# Patient Record
Sex: Female | Born: 1950
Health system: Southern US, Community
[De-identification: ages and names within clinical notes are randomized; demographics above are authoritative.]

## PROBLEM LIST (undated history)

## (undated) DIAGNOSIS — E039 Hypothyroidism, unspecified: Secondary | ICD-10-CM

## (undated) DIAGNOSIS — R002 Palpitations: Secondary | ICD-10-CM

## (undated) HISTORY — PX: OTHER SURGICAL HISTORY: SHX169

## (undated) HISTORY — DX: Palpitations: R00.2

## (undated) HISTORY — PX: APPENDECTOMY: SHX54

## (undated) HISTORY — DX: Hypothyroidism, unspecified: E03.9

## (undated) HISTORY — PX: BREAST ENHANCEMENT SURGERY: SHX7

## (undated) HISTORY — PX: PARTIAL HYSTERECTOMY: SHX80

---

## 2001-05-30 ENCOUNTER — Encounter: Admission: RE | Admit: 2001-05-30 | Discharge: 2001-05-30 | Payer: Self-pay | Admitting: *Deleted

## 2001-05-30 ENCOUNTER — Encounter: Payer: Self-pay | Admitting: *Deleted

## 2011-10-28 ENCOUNTER — Emergency Department (HOSPITAL_COMMUNITY)
Admission: EM | Admit: 2011-10-28 | Discharge: 2011-10-29 | Disposition: A | Discharge status: Home / Self Care | Payer: BC Managed Care – PPO | Attending: Emergency Medicine | Admitting: Emergency Medicine

## 2011-10-28 ENCOUNTER — Encounter (HOSPITAL_COMMUNITY): Payer: Self-pay | Admitting: Emergency Medicine

## 2011-10-28 DIAGNOSIS — R42 Dizziness and giddiness: Secondary | ICD-10-CM | POA: Insufficient documentation

## 2011-10-28 DIAGNOSIS — I10 Essential (primary) hypertension: Secondary | ICD-10-CM | POA: Insufficient documentation

## 2011-10-28 DIAGNOSIS — R112 Nausea with vomiting, unspecified: Secondary | ICD-10-CM | POA: Insufficient documentation

## 2011-10-28 DIAGNOSIS — R202 Paresthesia of skin: Secondary | ICD-10-CM

## 2011-10-28 DIAGNOSIS — R209 Unspecified disturbances of skin sensation: Secondary | ICD-10-CM | POA: Insufficient documentation

## 2011-10-28 DIAGNOSIS — Z79899 Other long term (current) drug therapy: Secondary | ICD-10-CM | POA: Insufficient documentation

## 2011-10-28 LAB — CBC
HCT: 39.3 % (ref 36.0–46.0)
Hemoglobin: 13.6 g/dL (ref 12.0–15.0)
MCH: 30.2 pg (ref 26.0–34.0)
MCHC: 34.6 g/dL (ref 30.0–36.0)
MCV: 87.1 fL (ref 78.0–100.0)
Platelets: 316 10*3/uL (ref 150–400)
RBC: 4.51 MIL/uL (ref 3.87–5.11)
RDW: 13 % (ref 11.5–15.5)
WBC: 8.4 10*3/uL (ref 4.0–10.5)

## 2011-10-28 LAB — DIFFERENTIAL
Basophils Absolute: 0 10*3/uL (ref 0.0–0.1)
Basophils Relative: 0 % (ref 0–1)
Eosinophils Absolute: 0.1 10*3/uL (ref 0.0–0.7)
Eosinophils Relative: 2 % (ref 0–5)
Monocytes Absolute: 0.9 10*3/uL (ref 0.1–1.0)

## 2011-10-28 NOTE — ED Notes (Signed)
PT. REPORTS LEFT FACIAL NUMBNESS FOR SEVERAL DAYS SEEN BY PCP AND MRI DONE AT [facility] - RESULT IS NEGATIVE , PRESCRIBED WITH ANTIHYPERTENSIVE MEDICATION BY PCP , VOMITTING YESTERDAY.

## 2011-10-29 LAB — BASIC METABOLIC PANEL
CO2: 26 mEq/L (ref 19–32)
Calcium: 9.5 mg/dL (ref 8.4–10.5)
Creatinine, Ser: 0.65 mg/dL (ref 0.50–1.10)
GFR calc Af Amer: >90 mL/min (ref >90)
GFR calc non Af Amer: >90 mL/min (ref >90)
Sodium: 137 mEq/L (ref 135–145)

## 2011-10-29 MED ORDER — ONDANSETRON 4 MG PO TBDP
8.0000 mg | ORAL_TABLET | Freq: Once | ORAL | Status: AC
  Administered 2011-10-29: 8 mg via ORAL

## 2011-10-29 MED ORDER — ONDANSETRON 4 MG PO TBDP
ORAL_TABLET | ORAL | Status: AC
  Filled 2011-10-29: qty 2

## 2011-10-29 MED ORDER — ONDANSETRON 8 MG PO TBDP: 8.0000 mg | ORAL_TABLET | Freq: Three times a day (TID) | ORAL | Status: AC | PRN

## 2011-10-29 NOTE — ED Provider Notes (Signed)
History     CSN: [id number]  Arrival date & time 10/28/11  2137   First MD Initiated Contact with Patient 10/29/11 0132      Chief Complaint  Patient presents with  . Numbness     The history is provided by the patient.   patient reports several days of tingling of the left side of her head and face.  She had several transient episodes of "dizziness".  She had an MRI done as an outpatient today as well as an MRA of her brain which demonstrates no evidence of stroke and no aneurysms.  She has no stroke risk factors.  At this time she reports she feels good.  She has had several days of nausea and nonbloody nonbilious vomiting.  She denies diarrhea.. she has no abdominal pain.  Her symptoms are mild.  She presents the ER now because her family wants to know more about why she is having these paresthesias of the left side of her head  Past Medical History  Diagnosis Date  . Hypertension     History reviewed. No pertinent past surgical history.  No family history on file.  History  Substance Use Topics  . Smoking status: Never Smoker   . Smokeless tobacco: Not on file  . Alcohol Use: No    OB History    Grav Para Term Preterm Abortions TAB SAB Ect Mult Living                  Review of Systems  All other systems reviewed and are negative.    Allergies  Codeine and Sulfa antibiotics  Home Medications   Current Outpatient Rx  Name Route Sig Dispense Refill  . B COMPLEX-C PO TABS Oral Take 1 tablet by mouth daily.    Marland Kitchen SYNTHROID PO Oral Take 1 tablet by mouth every morning.    . CYTOMEL PO Oral Take 1 tablet by mouth daily.    . ADULT MULTIVITAMIN W/MINERALS CH Oral Take 1 tablet by mouth daily.    Marland Kitchen PROGESTERONE MICRONIZED 100 MG PO CAPS Oral Take 100 mg by mouth daily.      BP 131/50  Pulse 72  Temp(Src) 97.9 F (36.6 C) (Oral)  Resp 17  SpO2 100%  Physical Exam  Nursing note and vitals reviewed. Constitutional: She is oriented to person, place, and time.  She appears well-developed and well-nourished. No distress.  HENT:  Head: Normocephalic and atraumatic.  Eyes: EOM are normal. Pupils are equal, round, and reactive to light.  Neck: Normal range of motion.  Cardiovascular: Normal rate, regular rhythm and normal heart sounds.   Pulmonary/Chest: Effort normal and breath sounds normal.  Abdominal: Soft. She exhibits no distension. There is no tenderness.  Musculoskeletal: Normal range of motion.  Neurological: She is alert and oriented to person, place, and time.       5/5 strength in major muscle groups of  bilateral upper and lower extremities. Speech normal. No facial asymetry. Normal finger to nose bilaterally  Skin: Skin is warm and dry.  Psychiatric: She has a normal mood and affect. Judgment normal.    ED Course  Procedures (including critical care time)   Labs Reviewed  CBC  DIFFERENTIAL  BASIC METABOLIC PANEL   No results found.   1. Paresthesia       MDM  The patient has paresthesias of her left head and face.  Her MRI and MRA of her head today done as an outpatient demonstrates no evidence  of stroke.  She has no carotid bruits.  She has no stroke risk factors.  DC home with urology followup        Lyanne Co, MD 10/29/11 [id number]

## 2011-10-29 NOTE — Discharge Instructions (Signed)
Paresthesia  Paresthesia is an abnormal burning or prickling sensation. This sensation is generally felt in the hands, arms, legs, or feet. However, it may occur in any part of the body. It is usually not painful. The feeling may be described as:  · Tingling or numbness.  · "Pins and needles."  · Skin crawling.  · Buzzing.  · Limbs "falling asleep."  · Itching.  Most people experience temporary (transient) paresthesia at some time in their lives.  CAUSES   Paresthesia may occur when you breathe too quickly (hyperventilation). It can also occur without any apparent cause. Commonly, paresthesia occurs when pressure is placed on a nerve. The feeling quickly goes away once the pressure is removed. For some people, however, paresthesia is a long-lasting (chronic) condition caused by an underlying disorder. The underlying disorder may be:  · A traumatic, direct injury to nerves. Examples include a:  · Broken (fractured) neck.  · Fractured skull.  · A disorder affecting the brain and spinal cord (central nervous system). Examples include:  · Transverse myelitis.  · Encephalitis.  · Transient ischemic attack.  · Multiple sclerosis.  · Stroke.  · Tumor or blood vessel problems, such as an arteriovenous malformation pressing against the brain or spinal cord.  · A condition that damages the peripheral nerves (peripheral neuropathy). Peripheral nerves are not part of the brain and spinal cord. These conditions include:  · Diabetes.  · Peripheral vascular disease.  · Nerve entrapment syndromes, such as carpal tunnel syndrome.  · Shingles.  · Hypothyroidism.  · Vitamin B12 deficiencies.  · Alcoholism.  · Heavy metal poisoning (lead, arsenic).  · Rheumatoid arthritis.  · Systemic lupus erythematosus.  DIAGNOSIS   Your caregiver will attempt to find the underlying cause of your paresthesia. Your caregiver may:  · Take your medical history.  · Perform a physical exam.  · Order various lab tests.  · Order imaging tests.  TREATMENT    Treatment for paresthesia depends on the underlying cause.  HOME CARE INSTRUCTIONS  · Avoid drinking alcohol.  · You may consider massage or acupuncture to help relieve your symptoms.  · Keep all follow-up appointments as directed by your caregiver.  SEEK IMMEDIATE MEDICAL CARE IF:   · You feel weak.  · You have trouble walking or moving.  · You have problems with speech or vision.  · You feel confused.  · You cannot control your bladder or bowel movements.  · You feel numbness after an injury.  · You faint.  · Your burning or prickling feeling gets worse when walking.  · You have pain, cramps, or dizziness.  · You develop a rash.  MAKE SURE YOU:  · Understand these instructions.  · Will watch your condition.  · Will get help right away if you are not doing well or get worse.  Document Released: 05/24/2002 Document Revised: 05/23/2011 Document Reviewed: 02/22/2011  Exit[employer]® Patient Information ©2012 [employer].

## 2011-10-29 NOTE — ED Notes (Signed)
Pt c/o numbness that started in left eye several days ago.  Has spread through jaw and left neck.  Denies injury.  Comments that blood pressure has been higher than normal.  Received MRI earlier today and has results with her.  Was told MRI/MRA was normal.  Also states numbness is in parts of left arm.  Complaining of HA on left side of head and n/v.

## 2014-04-18 ENCOUNTER — Other Ambulatory Visit (HOSPITAL_COMMUNITY): Payer: Self-pay | Admitting: Internal Medicine

## 2014-04-18 ENCOUNTER — Ambulatory Visit (HOSPITAL_COMMUNITY)
Admission: RE | Admit: 2014-04-18 | Discharge: 2014-04-18 | Disposition: A | Discharge status: Home / Self Care | Payer: BC Managed Care – PPO | Source: Ambulatory Visit | Attending: Internal Medicine | Admitting: Internal Medicine

## 2014-04-18 ENCOUNTER — Ambulatory Visit (HOSPITAL_COMMUNITY): Admission: RE | Admit: 2014-04-18 | Payer: BC Managed Care – PPO | Source: Ambulatory Visit

## 2014-04-18 DIAGNOSIS — R109 Unspecified abdominal pain: Secondary | ICD-10-CM | POA: Diagnosis present

## 2014-04-18 DIAGNOSIS — N202 Calculus of kidney with calculus of ureter: Secondary | ICD-10-CM

## 2014-04-25 ENCOUNTER — Other Ambulatory Visit (HOSPITAL_COMMUNITY): Payer: BC Managed Care – PPO

## 2014-06-01 ENCOUNTER — Encounter (INDEPENDENT_AMBULATORY_CARE_PROVIDER_SITE_OTHER): Payer: Self-pay | Admitting: *Deleted

## 2014-07-12 ENCOUNTER — Encounter (INDEPENDENT_AMBULATORY_CARE_PROVIDER_SITE_OTHER): Payer: Self-pay | Admitting: Internal Medicine

## 2014-07-12 ENCOUNTER — Ambulatory Visit (INDEPENDENT_AMBULATORY_CARE_PROVIDER_SITE_OTHER): Payer: BLUE CROSS/BLUE SHIELD | Admitting: Internal Medicine

## 2014-07-12 ENCOUNTER — Other Ambulatory Visit (INDEPENDENT_AMBULATORY_CARE_PROVIDER_SITE_OTHER): Payer: Self-pay | Admitting: *Deleted

## 2014-07-12 ENCOUNTER — Telehealth (INDEPENDENT_AMBULATORY_CARE_PROVIDER_SITE_OTHER): Payer: Self-pay | Admitting: *Deleted

## 2014-07-12 VITALS — BP 154/64 | HR 72 | Temp 97.7°F | Ht 63.0 in | Wt 152.1 lb

## 2014-07-12 DIAGNOSIS — Z1211 Encounter for screening for malignant neoplasm of colon: Secondary | ICD-10-CM

## 2014-07-12 DIAGNOSIS — N200 Calculus of kidney: Secondary | ICD-10-CM

## 2014-07-12 DIAGNOSIS — K625 Hemorrhage of anus and rectum: Secondary | ICD-10-CM

## 2014-07-12 LAB — CBC WITH DIFFERENTIAL/PLATELET
BASOS PCT: 0 % (ref 0–1)
Basophils Absolute: 0 10*3/uL (ref 0.0–0.1)
EOS ABS: 0.2 10*3/uL (ref 0.0–0.7)
Eosinophils Relative: 3 % (ref 0–5)
HCT: 37.9 % (ref 36.0–46.0)
Hemoglobin: 13 g/dL (ref 12.0–15.0)
LYMPHS PCT: 35 % (ref 12–46)
Lymphs Abs: 2.6 10*3/uL (ref 0.7–4.0)
MCH: 29.7 pg (ref 26.0–34.0)
MCHC: 34.3 g/dL (ref 30.0–36.0)
MCV: 86.7 fL (ref 78.0–100.0)
MONO ABS: 1 10*3/uL (ref 0.1–1.0)
MONOS PCT: 13 % — AB (ref 3–12)
MPV: 9.2 fL (ref 8.6–12.4)
Neutro Abs: 3.7 10*3/uL (ref 1.7–7.7)
Neutrophils Relative %: 49 % (ref 43–77)
Platelets: 358 10*3/uL (ref 150–400)
RBC: 4.37 MIL/uL (ref 3.87–5.11)
RDW: 13.3 % (ref 11.5–15.5)
WBC: 7.5 10*3/uL (ref 4.0–10.5)

## 2014-07-12 NOTE — Telephone Encounter (Signed)
Patient needs trilyte 

## 2014-07-12 NOTE — Patient Instructions (Signed)
Colonoscopy. The risks and benefits such as perforation, bleeding, and infection were reviewed with the patient and is agreeable.The risks and benefits such as perforation, bleeding, and infection were reviewed with the patient and is agreeable. 

## 2014-07-12 NOTE — Progress Notes (Signed)
   Subjective:    Patient ID: Elizabeth Miranda, female    DOB: 12-03-1950, 64 y.o.   MRN: [id number]  HPI Referred to our office from Dr. Nevada Crane for rectal bleeding. She says she was also having a kidney stone.during this time.  She cannot tell me if she was bleeding from her urine or her rectum. This occurred in November. No further rectal bleeding. The bleeding occurred x 1 day and then resolved. She has never undergone a colonoscopy in the past.   Appetite is good. No weight loss. She feels great. No abdominal pain She has a BM x 2-3 times a day. No further rectal bleeding. No melena or BRRB. No family hx of colon cancer.  No NSAIDs.     Review of Systems Past Medical History  Diagnosis Date  . Hypothyroid     Past Surgical History  Procedure Laterality Date  . Appendectomy    . Partial hysterectomy    . Complete hysterectomy'      Allergies  Allergen Reactions  . Codeine     Hives   . Sulfa Antibiotics     Hives     Current Outpatient Prescriptions on File Prior to Visit  Medication Sig Dispense Refill  . B Complex-C (B-COMPLEX WITH VITAMIN C) tablet Take 1 tablet by mouth daily.    . Levothyroxine Sodium (SYNTHROID PO) Take 88 mcg by mouth every morning.     . Liothyronine Sodium (CYTOMEL PO) Take 1 tablet by mouth daily.    . Multiple Vitamin (MULITIVITAMIN WITH MINERALS) TABS Take 1 tablet by mouth daily.    . progesterone (PROMETRIUM) 100 MG capsule Take 100 mg by mouth daily.     No current facility-administered medications on file prior to visit.        Objective:   Physical Exam  Filed Vitals:   07/12/14 1519  Height: 5\' 3"  (1.6 m)  Weight: 152 lb 1.6 oz (68.992 kg)    Alert and oriented. Skin warm and dry. Oral mucosa is moist.   . Sclera anicteric, conjunctivae is pink. Thyroid not enlarged. No cervical lymphadenopathy. Lungs clear. Heart regular rate and rhythm.  Abdomen is soft. Bowel sounds are positive. No hepatomegaly. No abdominal masses  felt. No tenderness.  No edema to lower extremities. Stool brown and guaiac negative.       Assessment & Plan:  ?Rectal bleeding .Recent hx of kidney stone with blood in her urine. Has never undergone a colonoscopy in the past. Colonoscopy. The risks and benefits such as perforation, bleeding, and infection were reviewed with the patient and is agreeable. CBC today

## 2014-07-14 MED ORDER — PEG 3350-KCL-NA BICARB-NACL 420 G PO SOLR: 4000.0000 mL | Freq: Once | ORAL | Status: DC

## 2014-08-10 ENCOUNTER — Encounter (HOSPITAL_COMMUNITY): Admission: RE | Payer: Self-pay | Source: Ambulatory Visit

## 2014-08-10 ENCOUNTER — Ambulatory Visit (HOSPITAL_COMMUNITY)
Admission: RE | Admit: 2014-08-10 | Payer: BLUE CROSS/BLUE SHIELD | Source: Ambulatory Visit | Admitting: Internal Medicine

## 2014-08-10 SURGERY — COLONOSCOPY
Anesthesia: Moderate Sedation

## 2014-10-19 DIAGNOSIS — N959 Unspecified menopausal and perimenopausal disorder: Secondary | ICD-10-CM | POA: Diagnosis not present

## 2014-10-19 DIAGNOSIS — E063 Autoimmune thyroiditis: Secondary | ICD-10-CM | POA: Diagnosis not present

## 2014-10-19 DIAGNOSIS — E782 Mixed hyperlipidemia: Secondary | ICD-10-CM | POA: Diagnosis not present

## 2014-10-19 DIAGNOSIS — E282 Polycystic ovarian syndrome: Secondary | ICD-10-CM | POA: Diagnosis not present

## 2014-10-19 DIAGNOSIS — E039 Hypothyroidism, unspecified: Secondary | ICD-10-CM | POA: Diagnosis not present

## 2014-11-24 DIAGNOSIS — E063 Autoimmune thyroiditis: Secondary | ICD-10-CM | POA: Diagnosis not present

## 2014-11-24 DIAGNOSIS — E349 Endocrine disorder, unspecified: Secondary | ICD-10-CM | POA: Diagnosis not present

## 2014-11-24 DIAGNOSIS — E039 Hypothyroidism, unspecified: Secondary | ICD-10-CM | POA: Diagnosis not present

## 2014-11-24 DIAGNOSIS — E559 Vitamin D deficiency, unspecified: Secondary | ICD-10-CM | POA: Diagnosis not present

## 2014-12-07 DIAGNOSIS — T560X4A Toxic effect of lead and its compounds, undetermined, initial encounter: Secondary | ICD-10-CM | POA: Diagnosis not present

## 2014-12-07 DIAGNOSIS — T561X4A Toxic effect of mercury and its compounds, undetermined, initial encounter: Secondary | ICD-10-CM | POA: Diagnosis not present

## 2014-12-14 DIAGNOSIS — H259 Unspecified age-related cataract: Secondary | ICD-10-CM | POA: Diagnosis not present

## 2014-12-14 DIAGNOSIS — H524 Presbyopia: Secondary | ICD-10-CM | POA: Diagnosis not present

## 2015-02-03 DIAGNOSIS — E063 Autoimmune thyroiditis: Secondary | ICD-10-CM | POA: Diagnosis not present

## 2015-02-03 DIAGNOSIS — N959 Unspecified menopausal and perimenopausal disorder: Secondary | ICD-10-CM | POA: Diagnosis not present

## 2015-02-03 DIAGNOSIS — E039 Hypothyroidism, unspecified: Secondary | ICD-10-CM | POA: Diagnosis not present

## 2015-03-22 DIAGNOSIS — M9903 Segmental and somatic dysfunction of lumbar region: Secondary | ICD-10-CM | POA: Diagnosis not present

## 2015-03-22 DIAGNOSIS — M5441 Lumbago with sciatica, right side: Secondary | ICD-10-CM | POA: Diagnosis not present

## 2015-03-22 DIAGNOSIS — S335XXA Sprain of ligaments of lumbar spine, initial encounter: Secondary | ICD-10-CM | POA: Diagnosis not present

## 2015-03-27 DIAGNOSIS — S134XXA Sprain of ligaments of cervical spine, initial encounter: Secondary | ICD-10-CM | POA: Diagnosis not present

## 2015-03-27 DIAGNOSIS — M9902 Segmental and somatic dysfunction of thoracic region: Secondary | ICD-10-CM | POA: Diagnosis not present

## 2015-03-27 DIAGNOSIS — M9901 Segmental and somatic dysfunction of cervical region: Secondary | ICD-10-CM | POA: Diagnosis not present

## 2015-03-27 DIAGNOSIS — M546 Pain in thoracic spine: Secondary | ICD-10-CM | POA: Diagnosis not present

## 2015-03-27 DIAGNOSIS — M9903 Segmental and somatic dysfunction of lumbar region: Secondary | ICD-10-CM | POA: Diagnosis not present

## 2015-03-27 DIAGNOSIS — S335XXA Sprain of ligaments of lumbar spine, initial encounter: Secondary | ICD-10-CM | POA: Diagnosis not present

## 2015-03-27 DIAGNOSIS — M5441 Lumbago with sciatica, right side: Secondary | ICD-10-CM | POA: Diagnosis not present

## 2015-03-29 DIAGNOSIS — M9902 Segmental and somatic dysfunction of thoracic region: Secondary | ICD-10-CM | POA: Diagnosis not present

## 2015-03-29 DIAGNOSIS — M5441 Lumbago with sciatica, right side: Secondary | ICD-10-CM | POA: Diagnosis not present

## 2015-03-29 DIAGNOSIS — M546 Pain in thoracic spine: Secondary | ICD-10-CM | POA: Diagnosis not present

## 2015-03-29 DIAGNOSIS — S134XXA Sprain of ligaments of cervical spine, initial encounter: Secondary | ICD-10-CM | POA: Diagnosis not present

## 2015-03-29 DIAGNOSIS — S335XXA Sprain of ligaments of lumbar spine, initial encounter: Secondary | ICD-10-CM | POA: Diagnosis not present

## 2015-03-29 DIAGNOSIS — M9901 Segmental and somatic dysfunction of cervical region: Secondary | ICD-10-CM | POA: Diagnosis not present

## 2015-03-29 DIAGNOSIS — M9903 Segmental and somatic dysfunction of lumbar region: Secondary | ICD-10-CM | POA: Diagnosis not present

## 2015-04-28 DIAGNOSIS — M10059 Idiopathic gout, unspecified hip: Secondary | ICD-10-CM | POA: Diagnosis not present

## 2015-05-15 DIAGNOSIS — M79604 Pain in right leg: Secondary | ICD-10-CM | POA: Diagnosis not present

## 2015-05-15 DIAGNOSIS — M7061 Trochanteric bursitis, right hip: Secondary | ICD-10-CM | POA: Diagnosis not present

## 2015-05-16 DIAGNOSIS — M791 Myalgia: Secondary | ICD-10-CM | POA: Diagnosis not present

## 2015-06-20 DIAGNOSIS — M9903 Segmental and somatic dysfunction of lumbar region: Secondary | ICD-10-CM | POA: Diagnosis not present

## 2015-06-20 DIAGNOSIS — S338XXA Sprain of other parts of lumbar spine and pelvis, initial encounter: Secondary | ICD-10-CM | POA: Diagnosis not present

## 2015-06-21 DIAGNOSIS — S338XXA Sprain of other parts of lumbar spine and pelvis, initial encounter: Secondary | ICD-10-CM | POA: Diagnosis not present

## 2015-06-21 DIAGNOSIS — S73101A Unspecified sprain of right hip, initial encounter: Secondary | ICD-10-CM | POA: Diagnosis not present

## 2015-06-21 DIAGNOSIS — M9903 Segmental and somatic dysfunction of lumbar region: Secondary | ICD-10-CM | POA: Diagnosis not present

## 2015-06-22 DIAGNOSIS — S73101A Unspecified sprain of right hip, initial encounter: Secondary | ICD-10-CM | POA: Diagnosis not present

## 2015-06-22 DIAGNOSIS — S338XXA Sprain of other parts of lumbar spine and pelvis, initial encounter: Secondary | ICD-10-CM | POA: Diagnosis not present

## 2015-06-22 DIAGNOSIS — M9903 Segmental and somatic dysfunction of lumbar region: Secondary | ICD-10-CM | POA: Diagnosis not present

## 2015-06-23 DIAGNOSIS — S73101A Unspecified sprain of right hip, initial encounter: Secondary | ICD-10-CM | POA: Diagnosis not present

## 2015-06-23 DIAGNOSIS — M9903 Segmental and somatic dysfunction of lumbar region: Secondary | ICD-10-CM | POA: Diagnosis not present

## 2015-06-23 DIAGNOSIS — S338XXA Sprain of other parts of lumbar spine and pelvis, initial encounter: Secondary | ICD-10-CM | POA: Diagnosis not present

## 2015-06-26 DIAGNOSIS — S338XXA Sprain of other parts of lumbar spine and pelvis, initial encounter: Secondary | ICD-10-CM | POA: Diagnosis not present

## 2015-06-26 DIAGNOSIS — M9903 Segmental and somatic dysfunction of lumbar region: Secondary | ICD-10-CM | POA: Diagnosis not present

## 2015-06-26 DIAGNOSIS — S73101A Unspecified sprain of right hip, initial encounter: Secondary | ICD-10-CM | POA: Diagnosis not present

## 2015-06-30 DIAGNOSIS — S338XXA Sprain of other parts of lumbar spine and pelvis, initial encounter: Secondary | ICD-10-CM | POA: Diagnosis not present

## 2015-06-30 DIAGNOSIS — M7060 Trochanteric bursitis, unspecified hip: Secondary | ICD-10-CM | POA: Diagnosis not present

## 2015-06-30 DIAGNOSIS — S73101A Unspecified sprain of right hip, initial encounter: Secondary | ICD-10-CM | POA: Diagnosis not present

## 2015-06-30 DIAGNOSIS — M9903 Segmental and somatic dysfunction of lumbar region: Secondary | ICD-10-CM | POA: Diagnosis not present

## 2016-01-15 DIAGNOSIS — M79604 Pain in right leg: Secondary | ICD-10-CM | POA: Diagnosis not present

## 2016-01-22 ENCOUNTER — Other Ambulatory Visit (HOSPITAL_COMMUNITY): Payer: Self-pay | Admitting: Internal Medicine

## 2016-01-22 DIAGNOSIS — M79604 Pain in right leg: Secondary | ICD-10-CM

## 2016-01-22 DIAGNOSIS — M5417 Radiculopathy, lumbosacral region: Secondary | ICD-10-CM

## 2016-02-01 ENCOUNTER — Ambulatory Visit (HOSPITAL_COMMUNITY): Payer: BLUE CROSS/BLUE SHIELD

## 2016-02-01 ENCOUNTER — Ambulatory Visit (HOSPITAL_COMMUNITY)
Admission: RE | Admit: 2016-02-01 | Discharge: 2016-02-01 | Disposition: A | Discharge status: Home / Self Care | Payer: Medicare Other | Source: Ambulatory Visit | Attending: Internal Medicine | Admitting: Internal Medicine

## 2016-02-01 ENCOUNTER — Other Ambulatory Visit (HOSPITAL_COMMUNITY): Payer: BLUE CROSS/BLUE SHIELD

## 2016-02-01 DIAGNOSIS — M25551 Pain in right hip: Secondary | ICD-10-CM | POA: Diagnosis not present

## 2016-02-01 DIAGNOSIS — M5416 Radiculopathy, lumbar region: Secondary | ICD-10-CM | POA: Insufficient documentation

## 2016-02-01 DIAGNOSIS — M79604 Pain in right leg: Secondary | ICD-10-CM

## 2016-02-01 DIAGNOSIS — M5417 Radiculopathy, lumbosacral region: Secondary | ICD-10-CM

## 2016-02-01 DIAGNOSIS — M769 Unspecified enthesopathy, lower limb, excluding foot: Secondary | ICD-10-CM | POA: Insufficient documentation

## 2016-03-11 ENCOUNTER — Ambulatory Visit (HOSPITAL_COMMUNITY): Payer: BLUE CROSS/BLUE SHIELD | Admitting: Physical Therapy

## 2016-03-13 ENCOUNTER — Ambulatory Visit (HOSPITAL_COMMUNITY): Payer: Medicare Other | Attending: Internal Medicine

## 2016-03-13 DIAGNOSIS — M25552 Pain in left hip: Secondary | ICD-10-CM | POA: Insufficient documentation

## 2016-03-13 DIAGNOSIS — M25551 Pain in right hip: Secondary | ICD-10-CM | POA: Insufficient documentation

## 2016-03-13 DIAGNOSIS — M6281 Muscle weakness (generalized): Secondary | ICD-10-CM | POA: Diagnosis not present

## 2016-03-13 NOTE — Therapy (Signed)
[facility] [place], [place], [address] Phone: [phone]   Fax:  [phone]  Physical Therapy Evaluation  Patient Details  Name: Elizabeth Miranda MRN: [id number] Date of Birth: 22-Jul-1950 Referring Provider: Celene Squibb   Encounter Date: 03/13/2016      PT End of Session - 03/13/16 1639    Visit Number 1   Number of Visits 6   Date for PT Re-Evaluation 04/12/16   Authorization Type Medicare trad   Authorization Time Period 03/13/16-04/24/16   Authorization - Visit Number 1   Authorization - Number of Visits 10   PT Start Time 1519   PT Stop Time 1635   PT Time Calculation (min) 76 min   Activity Tolerance Patient tolerated treatment well;Patient limited by fatigue;Patient limited by pain      Past Medical History:  Diagnosis Date  . Hypothyroid     Past Surgical History:  Procedure Laterality Date  . APPENDECTOMY    . complete hysterectomy'    . PARTIAL HYSTERECTOMY      There were no vitals filed for this visit.       Subjective Assessment - 03/13/16 1525    Subjective Pt reports back in August 2016 some postrior hip pain that continued to worsen and gradually become probelmatic on the R side as well. She had negative XRAY, but subsequent MRI revealing some insertional hamstrings tendonopathy. Since has resolved about 75% overall since she updated to more firm mattress. Also has a history of some crossFit , walking for activity, and other home exercises.    Pertinent History no prior history.    Limitations --  Ascending Steps at home is painful.    Currently in Pain? Yes   Pain Score 2    Pain Location Buttocks   Pain Orientation Right;Left  R>L   Pain Descriptors / Indicators Sharp   Pain Radiating Towards R knee down into just below the knee joint.    Pain Onset Other (comment)  >1yearAgo    Aggravating Factors  Lying on R side at night, going up steps    Pain Relieving Factors NSAIDs  originally not helpful; mostly extra strength tylenol also not too helpful.             [facility] PT Assessment - 03/13/16 0001      Assessment   Medical Diagnosis R posterio buttocks pain    Referring Provider Celene Squibb    Onset Date/Surgical Date --  >1 year ago   Hand Dominance Right   Next MD Visit None   Prior Therapy Chiro (TENS, ultrasound); Massage (c short term relief)      Balance Screen   Has the patient fallen in the past 6 months No   Has the patient had a decrease in activity level because of a fear of falling?  Yes   Is the patient reluctant to leave their home because of a fear of falling?  No     Prior Function   Level of Independence Independent     ROM / Strength   AROM / PROM / Strength Strength;PROM     PROM   Overall PROM Comments Abduction: L->40*; R->32* (painful)  Hypermobile Hip Flexion and gross rotation, fem antev B      Strength   Strength Assessment Site Hip;Knee;Ankle   Right/Left Hip Right;Left   Right Hip Flexion 5/5   Right Hip Extension --  HS 4/5; GMax 5/5   Right  Hip External Rotation  5/5   Right Hip Internal Rotation 5/5   Right Hip ABduction --  Seated: 5/5, TFL: 4/5 (painful), GMed: 3+/5   Right Hip ADduction 5/5   Left Hip Flexion 5/5   Left Hip Extension --  HS 4/5; GMax 4+/5   Left Hip External Rotation 4+/5  pain at left lateral hip   Left Hip Internal Rotation 5/5   Left Hip ABduction --  Seated: 5/5, TFL: 4/5 (painful), GMed: 3+/5   Left Hip ADduction 5/5   Right/Left Knee Right;Left   Right Knee Flexion 5/5  pain free   Right Knee Extension 5/5   Left Knee Flexion 5/5  pain free   Left Knee Extension 5/5   Right Ankle Dorsiflexion 5/5   Left Ankle Dorsiflexion 5/5     Flexibility   Soft Tissue Assessment /Muscle Length yes   Hamstrings --  R Long Adductors/Gracilis: tight, boggy, painful.      Palpation   Spinal mobility Central pain c springing at L4; L lateral pain springing L2   SI assessment   Appears intact/uninvolved.      Hip Special Tests: -Grind Test for hip OA: Negative Bilat -FADIR Test for anterior impingement: Negative Bilat  -Patricks Test: negative bilat -External derotation test: Postive on R for glute medius tendinosis.  -Resisted Abducted Straight Leg Raise: positive for TFL pain/weakness bilat (L>R) -Pubic Symphysis Shotgun Test: negative -Step Down Test: positive bilat for valgus moment at the knee  Hamstrings examination: -palpation of the insertional tendon: without pain bilat -resisted knee flexion seated: without pain bilat -resisted knee flexion prone: without pain bilat -resisted prone straight leg raise: without pain bilat         PT Education - 03/13/16 1637    Education provided Yes   Education Details Finding not supportive of intraarticular pathology of hip, nor of hamstrings tendon pathology. Explained positive tests, and low level suspicious for lumbar spine involvement.    Person(s) Educated Patient   Methods Explanation;Demonstration   Comprehension Verbalized understanding          PT Short Term Goals - 03/13/16 2125      PT SHORT TERM GOAL #1   Title After 4 weeks patient will report a 50% increase in tolerated walking time during weekly exercise.    Status New     PT SHORT TERM GOAL #2   Title After 4 weeks patient will report <2/10 pain with palpation of bilat gluteal musculature to demonstrate improved fascial restrictions.    Status New     PT SHORT TERM GOAL #3   Title After 4 weeks patient will demonstrate sidelying hip abduction of 15x without stopping to demonstrate improved hip strength.            PT Long Term Goals - 03/13/16 2127      PT LONG TERM GOAL #1   Title After 6 weeks patient will report 100% increase in tolerated walking time during exercise.    Status New     PT LONG TERM GOAL #2   Title After 6 weeks patient will report 0/10 pain with ascending/descending <12 steps.    Status New     PT  LONG TERM GOAL #3   Title After 6 weeks patient will demonstrate indepdence in advanced home exercise program to continue hip strengthening after dischrge.    Status New               Plan - 03/13/16 2115  Clinical Impression Statement Pt reports a long history of bilateral trochanteric pain, originating on the R and then eventually bilaterally. Her historic aggravating factors are consistent with typical trochanteric pain syndrome. She demonstrates muscle spasm in bilat glute med R>L, tightness in R long adductors with pain, pain and weakness in bilat TFL, tenderness at the lumbar spine, and reduced tolerance of sleeping postitions and leisure activity. Pt reports imaging studies indicating some hamstrings insertional tendinopathy, however all attempts to recreate pts symptoms through HS are negative. Special testing of hip intrcapsular patholoy is negative and seems unlikely, which includes labral lesion. Special test of Glute med tendinous injury is positive on right side. Presentation suspicious for lumbar spine etiology but all testing negative at this time.    Rehab Potential Excellent   Clinical Impairments Affecting Rehab Potential chronicity suspicious for degenerative tendinosis.    PT Frequency 1x / week   PT Duration 6 weeks   PT Treatment/Interventions ADLs/Self Care Home Management;Moist Heat;Electrical Stimulation;Gait training;Stair training;Functional mobility training;Therapeutic activities;Therapeutic exercise;Balance training;Manual techniques;Patient/family education;Dry needling;Passive range of motion   PT Next Visit Plan Review HEP, get SLS balance times, MFR to glute med, TFL, R long adductors.    PT Home Exercise Plan 03/25/2016: Clam band, reverse clam, sidelying hip abduction.    Consulted and Agree with Plan of Care Patient      Patient will benefit from skilled therapeutic intervention in order to improve the following deficits and impairments:  Abnormal gait,  Increased fascial restricitons, Pain, Postural dysfunction, Increased muscle spasms, Hypermobility, Decreased activity tolerance, Hypomobility, Decreased strength  Visit Diagnosis: Pain in right hip  Pain in left hip  Muscle weakness (generalized)      G-Codes - Mar 25, 2016 2128-09-27    Functional Assessment Tool Used Clinical Judgment   Functional Limitation Mobility: Walking and moving around   Mobility: Walking and Moving Around Current Status [phone]) At least 20 percent but less than 40 percent impaired, limited or restricted   Mobility: Walking and Moving Around Goal Status [phone]) At least 1 percent but less than 20 percent impaired, limited or restricted       Problem List There are no active problems to display for this patient.  9:39 PM, 2016/03/25 Etta Grandchild, PT, DPT Physical Therapist at [facility] [phone] (office)     [facility] [address] [place], [place], [address] Phone: [phone]   Fax:  [phone]  Name: JINELLE DOLSEN MRN: [id number] Date of Birth: [birth year]

## 2016-03-15 ENCOUNTER — Ambulatory Visit (HOSPITAL_COMMUNITY): Payer: Medicare Other

## 2016-03-15 DIAGNOSIS — M6281 Muscle weakness (generalized): Secondary | ICD-10-CM | POA: Diagnosis not present

## 2016-03-15 DIAGNOSIS — M25551 Pain in right hip: Secondary | ICD-10-CM

## 2016-03-15 DIAGNOSIS — M25552 Pain in left hip: Secondary | ICD-10-CM

## 2016-03-15 NOTE — Therapy (Signed)
[facility], [place], [address] Phone: [phone]   Fax:  [phone]  Physical Therapy Treatment  Patient Details  Name: Elizabeth Miranda MRN: [id number] Date of Birth: 1950/08/04 Referring Provider: Celene Squibb   Encounter Date: 03/15/2016      PT End of Session - 03/15/16 1252    Visit Number 2   Number of Visits 6   Date for PT Re-Evaluation 04/12/16   Authorization Type Medicare trad   Authorization Time Period 03/13/16-04/24/16   Authorization - Visit Number 2   Authorization - Number of Visits 10   PT Start Time 1040   PT Stop Time 1119   PT Time Calculation (min) 39 min   Equipment Utilized During Treatment Gait belt   Activity Tolerance Patient tolerated treatment well;Patient limited by fatigue;Patient limited by pain   Behavior During Therapy [facility] for tasks assessed/performed      Past Medical History:  Diagnosis Date  . Hypothyroid     Past Surgical History:  Procedure Laterality Date  . APPENDECTOMY    . complete hysterectomy'    . PARTIAL HYSTERECTOMY      There were no vitals filed for this visit.      Subjective Assessment - 03/15/16 1044    Subjective Pt reports she had some soreness after evaluation. Her exercises are going fairly well, but will be reviewed today.    Pertinent History no prior history.    Currently in Pain? No/denies                         [facility] Adult PT Treatment/Exercise - 03/15/16 0001      Self-Care   Self-Care Heat/Ice Application;Other Self-Care Comments  Self Myofascial release   Heat/Ice Application heat application to glute med trigger points/taught bands   Other Self-Care Comments  self release with rolling on foam roller or ball     Exercises   Exercises Knee/Hip     Knee/Hip Exercises: Supine   Knee Flexion Limitations Clamshell with band (HEP review)  1x15 c redTB     Knee/Hip Exercises: Sidelying   Hip ABduction  Strengthening;Both;2 sets;5 reps  1x10 bilat (HEP form review)    Other Sidelying Knee/Hip Exercises reverse clam   1x10 bilat (HEP form review)      Manual Therapy   Manual Therapy Myofascial release   Myofascial Release Glute med bilat   5 minutes for patient education on self release.                 PT Education - 03/15/16 1251    Education provided Yes   Education Details extensive educaiton on self myofascial release to glute med bilat, as well as how to safely apply heat as an adjunct.    Person(s) Educated Patient   Methods Explanation;Demonstration   Comprehension Verbalized understanding          PT Short Term Goals - 03/13/16 2125      PT SHORT TERM GOAL #1   Title After 4 weeks patient will report a 50% increase in tolerated walking time during weekly exercise.    Status New     PT SHORT TERM GOAL #2   Title After 4 weeks patient will report <2/10 pain with palpation of bilat gluteal musculature to demonstrate improved fascial restrictions.    Status New     PT SHORT TERM GOAL #3   Title After 4 weeks patient  will demonstrate sidelying hip abduction of 15x without stopping to demonstrate improved hip strength.            PT Long Term Goals - 03/13/16 2127      PT LONG TERM GOAL #1   Title After 6 weeks patient will report 100% increase in tolerated walking time during exercise.    Status New     PT LONG TERM GOAL #2   Title After 6 weeks patient will report 0/10 pain with ascending/descending <12 steps.    Status New     PT LONG TERM GOAL #3   Title After 6 weeks patient will demonstrate indepdence in advanced home exercise program to continue hip strengthening after dischrge.    Status New               Plan - 03/15/16 1254    Clinical Impression Statement Pt progressing well, some mild soreness after th evaluation, but she has started on herp HEP and is noted to demonstrate some imporved activation of muscles groups already. Goals  are discussed at length and more education is given on self care adn treatment at home. assessment and brief treatment given on myofascial release in tbilt glute med, with higher concerntration of taut bands and pain on R with local pain rather referral to the tendon. The R glute med tendon is quite sore with exercises as expected, but tolerated well.    Rehab Potential Excellent   Clinical Impairments Affecting Rehab Potential chronicity suspicious for degenerative tendinosis.    PT Frequency 1x / week   PT Duration 6 weeks   PT Treatment/Interventions ADLs/Self Care Home Management;Moist Heat;Electrical Stimulation;Gait training;Stair training;Functional mobility training;Therapeutic activities;Therapeutic exercise;Balance training;Manual techniques;Patient/family education;Dry needling;Passive range of motion   PT Next Visit Plan Get SLS balance times, MFR to glute med, R long adductors. Add in bridging to HEP.    PT Home Exercise Plan 03/13/16: Clam band, reverse clam, sidelying hip abduction.    Consulted and Agree with Plan of Care Patient      Patient will benefit from skilled therapeutic intervention in order to improve the following deficits and impairments:  Abnormal gait, Increased fascial restricitons, Pain, Postural dysfunction, Increased muscle spasms, Hypermobility, Decreased activity tolerance, Hypomobility, Decreased strength  Visit Diagnosis: Pain in right hip  Pain in left hip  Muscle weakness (generalized)     Problem List There are no active problems to display for this patient.  1:01 PM, 03/15/16 Etta Grandchild, PT, DPT Physical Therapist at [facility] [phone] (office)     [place], [place], [address] Phone: [phone]   Fax:  [phone]  Name: Elizabeth Miranda MRN: [id number] Date of Birth: 08-Dec-1950

## 2016-03-18 ENCOUNTER — Ambulatory Visit (HOSPITAL_COMMUNITY): Payer: Medicare Other | Admitting: Physical Therapy

## 2016-03-18 ENCOUNTER — Telehealth (HOSPITAL_COMMUNITY): Payer: Self-pay

## 2016-03-18 NOTE — Telephone Encounter (Signed)
10/2 pt left a messge that she was still recovering (?) and would be at her next appointment

## 2016-03-29 ENCOUNTER — Ambulatory Visit (HOSPITAL_COMMUNITY): Payer: Medicare Other | Attending: Internal Medicine

## 2016-03-29 DIAGNOSIS — M25551 Pain in right hip: Secondary | ICD-10-CM | POA: Diagnosis not present

## 2016-03-29 DIAGNOSIS — M25552 Pain in left hip: Secondary | ICD-10-CM | POA: Insufficient documentation

## 2016-03-29 DIAGNOSIS — M6281 Muscle weakness (generalized): Secondary | ICD-10-CM | POA: Insufficient documentation

## 2016-03-29 NOTE — Therapy (Signed)
[place], [place], [address] Phone: [phone]   Fax:  [phone]  Physical Therapy Treatment  Patient Details  Name: Elizabeth Miranda MRN: [id number] Date of Birth: [birth year] Referring Provider: Celene Squibb   Encounter Date: 03/29/2016      PT End of Session - 03/29/16 1527    Visit Number 3   Number of Visits 6   Date for PT Re-Evaluation 04/12/16   Authorization Type Medicare trad   Authorization Time Period 03/13/16-04/24/16   Authorization - Visit Number 3   Authorization - Number of Visits 10   PT Start Time [id number]   PT Stop Time 1523   PT Time Calculation (min) 38 min   Activity Tolerance Patient tolerated treatment well;No increased pain   Behavior During Therapy WFL for tasks assessed/performed      Past Medical History:  Diagnosis Date  . Hypothyroid     Past Surgical History:  Procedure Laterality Date  . APPENDECTOMY    . complete hysterectomy'    . PARTIAL HYSTERECTOMY      There were no vitals filed for this visit.                       [facility] Adult PT Treatment/Exercise - 03/29/16 0001      Knee/Hip Exercises: Supine   Bridges with Diona Foley Squeeze 2 sets;10 reps     Knee/Hip Exercises: Sidelying   Hip ABduction Strengthening;Both;5 reps;1 set  1x8 c 3#    Other Sidelying Knee/Hip Exercises SLS, contralat leg abducted 10x5sec bilat   Other Sidelying Knee/Hip Exercises Glute max bridge, feet elevated 18 inches, knees at 90*  1x10     Manual Therapy   Manual Therapy Myofascial release;Passive ROM   Myofascial Release Glute med Right   5 minutes; remains taut and painful    Passive ROM Prione Hip ER stretching 3x30sec                  PT Short Term Goals - 03/13/16 2125      PT SHORT TERM GOAL #1   Title After 4 weeks patient will report a 50% increase in tolerated walking time during weekly exercise.    Status New     PT SHORT TERM GOAL #2   Title After 4 weeks patient will report <2/10 pain with palpation of bilat gluteal musculature to demonstrate improved fascial restrictions.    Status New     PT SHORT TERM GOAL #3   Title After 4 weeks patient will demonstrate sidelying hip abduction of 15x without stopping to demonstrate improved hip strength.            PT Long Term Goals - 03/13/16 2127      PT LONG TERM GOAL #1   Title After 6 weeks patient will report 100% increase in tolerated walking time during exercise.    Status New     PT LONG TERM GOAL #2   Title After 6 weeks patient will report 0/10 pain with ascending/descending <12 steps.    Status New     PT LONG TERM GOAL #3   Title After 6 weeks patient will demonstrate indepdence in advanced home exercise program to continue hip strengthening after dischrge.    Status New               Plan - 03/29/16 1528    Clinical Impression Statement Pt prgoressing well toward goals, progress  made in many areas, with improved hip strength, improved activity tolerance, and improved ROM. Noted excellent demonstration of propper form carried over from last session.    Rehab Potential Excellent   Clinical Impairments Affecting Rehab Potential chronicity suspicious for degenerative tendinosis.    PT Frequency 1x / week   PT Duration 6 weeks   PT Treatment/Interventions ADLs/Self Care Home Management;Moist Heat;Electrical Stimulation;Gait training;Stair training;Functional mobility training;Therapeutic activities;Therapeutic exercise;Balance training;Manual techniques;Patient/family education;Dry needling;Passive range of motion   PT Next Visit Plan Get SLS balance times, MFR to glute med, R long adductors. Add in bridging to HEP.    PT Home Exercise Plan 03/13/16: Clam band, reverse clam, sidelying hip abduction: 10/13: SLS balance, bridge with squeeze, Supine hip ER stretch   Consulted and Agree with Plan of Care Patient      Patient will benefit from skilled  therapeutic intervention in order to improve the following deficits and impairments:  Abnormal gait, Increased fascial restricitons, Pain, Postural dysfunction, Increased muscle spasms, Hypermobility, Decreased activity tolerance, Hypomobility, Decreased strength  Visit Diagnosis: Pain in right hip  Pain in left hip  Muscle weakness (generalized)     Problem List There are no active problems to display for this patient.   3:31 PM, 03/29/16 Etta Grandchild, PT, DPT Physical Therapist at [facility] [phone] (office)     [facility] [address] [place], [place], [address] Phone: [phone]   Fax:  [phone]  Name: Elizabeth Miranda MRN: [id number] Date of Birth: 1951-03-09

## 2016-04-05 ENCOUNTER — Ambulatory Visit (HOSPITAL_COMMUNITY): Payer: Medicare Other

## 2016-04-05 DIAGNOSIS — M25551 Pain in right hip: Secondary | ICD-10-CM | POA: Diagnosis not present

## 2016-04-05 DIAGNOSIS — M25552 Pain in left hip: Secondary | ICD-10-CM | POA: Diagnosis not present

## 2016-04-05 DIAGNOSIS — M6281 Muscle weakness (generalized): Secondary | ICD-10-CM

## 2016-04-05 NOTE — Therapy (Signed)
[facility] [place], [place], [address] Phone: [phone]   Fax:  [phone]  Physical Therapy Treatment  Patient Details  Name: Elizabeth Miranda MRN: [id number] Date of Birth: 01/12/1951 Referring Provider: Celene Squibb   Encounter Date: 04/05/2016      PT End of Session - 04/05/16 1523    Visit Number 4   Number of Visits 6   Date for PT Re-Evaluation 04/12/16   Authorization Type Medicare trad   Authorization Time Period 03/13/16-04/24/16   Authorization - Visit Number 4   Authorization - Number of Visits 10   PT Start Time [id number]   PT Stop Time 1522   PT Time Calculation (min) 47 min   Equipment Utilized During Treatment Gait belt   Activity Tolerance Patient tolerated treatment well;No increased pain   Behavior During Therapy WFL for tasks assessed/performed      Past Medical History:  Diagnosis Date  . Hypothyroid     Past Surgical History:  Procedure Laterality Date  . APPENDECTOMY    . complete hysterectomy'    . PARTIAL HYSTERECTOMY      There were no vitals filed for this visit.      Subjective Assessment - 04/05/16 1440    Subjective Pt reports some overall continued soreness in the R anterior hip which she says pniched during her rotation stretches. She also noted some 3 day soreness after MFR to right glutes.  Now much better. Walking longer distances is much better tolerates and better.                          [facility] Adult PT Treatment/Exercise - 04/05/16 0001      Knee/Hip Exercises: Standing   SLS 5x10sec bilat  Airex foam    SLS with Vectors 1x45sec bilat on bosu, firm up.    Other Standing Knee Exercises lateral side stepping: 4x36feet 3# weights on ankles     Knee/Hip Exercises: Supine   Bridges Limitations 2x10, concentric up, eccentric down single leg  noted drop on R performance; broken into 2 sets of 5                  PT Short Term Goals -  03/13/16 2125      PT SHORT TERM GOAL #1   Title After 4 weeks patient will report a 50% increase in tolerated walking time during weekly exercise.    Status New     PT SHORT TERM GOAL #2   Title After 4 weeks patient will report <2/10 pain with palpation of bilat gluteal musculature to demonstrate improved fascial restrictions.    Status New     PT SHORT TERM GOAL #3   Title After 4 weeks patient will demonstrate sidelying hip abduction of 15x without stopping to demonstrate improved hip strength.            PT Long Term Goals - 03/13/16 2127      PT LONG TERM GOAL #1   Title After 6 weeks patient will report 100% increase in tolerated walking time during exercise.    Status New     PT LONG TERM GOAL #2   Title After 6 weeks patient will report 0/10 pain with ascending/descending <12 steps.    Status New     PT LONG TERM GOAL #3   Title After 6 weeks patient will demonstrate indepdence in advanced home exercise program to continue  hip strengthening after dischrge.    Status New               Plan - 04/05/16 1524    Clinical Impression Statement Exercise session going well today, and HEP additions reviewed with some needed correction for form. Pt hasd some confusion regarding hip ER stretch had pinched the inside if her R hip. With form correction, she is able to perform pain free. Therex is advanced this session, with noted easy fatigue, but adequate breaks allow patient to perform all  as planned. Progress toward goals is improving. SLS balance is 60+ bilat, so HEP is advanced and therex is moved to foam.    Rehab Potential Excellent   Clinical Impairments Affecting Rehab Potential chronicity suspicious for degenerative tendinosis.    PT Frequency 1x / week   PT Duration 6 weeks   PT Treatment/Interventions ADLs/Self Care Home Management;Moist Heat;Electrical Stimulation;Gait training;Stair training;Functional mobility training;Therapeutic activities;Therapeutic  exercise;Balance training;Manual techniques;Patient/family education;Dry needling;Passive range of motion   PT Next Visit Plan Continue to progress glute med strength bilat.    PT Home Exercise Plan 03/13/16: Clam band, reverse clam, sidelying hip abduction: 10/13: SLS balance, bridge with squeeze, Supine hip ER stretch   Consulted and Agree with Plan of Care Patient      Patient will benefit from skilled therapeutic intervention in order to improve the following deficits and impairments:  Abnormal gait, Increased fascial restricitons, Pain, Postural dysfunction, Increased muscle spasms, Hypermobility, Decreased activity tolerance, Hypomobility, Decreased strength  Visit Diagnosis: Pain in right hip  Pain in left hip  Muscle weakness (generalized)     Problem List There are no active problems to display for this patient.  3:28 PM, 04/05/16 Etta Grandchild, PT, DPT Physical Therapist at [facility] [phone] (office)     [facility] [address] [place], [place], [address] Phone: [phone]   Fax:  [phone]  Name: ALLAYNA ADEL MRN: [id number] Date of Birth: March 15, 1951

## 2016-04-15 ENCOUNTER — Ambulatory Visit (HOSPITAL_COMMUNITY): Payer: Medicare Other | Admitting: Physical Therapy

## 2016-04-15 DIAGNOSIS — M25552 Pain in left hip: Secondary | ICD-10-CM | POA: Diagnosis not present

## 2016-04-15 DIAGNOSIS — M25551 Pain in right hip: Secondary | ICD-10-CM | POA: Diagnosis not present

## 2016-04-15 DIAGNOSIS — M6281 Muscle weakness (generalized): Secondary | ICD-10-CM | POA: Diagnosis not present

## 2016-04-15 NOTE — Therapy (Signed)
[facility], [place], [address] Phone: [phone]   Fax:  [phone]  Physical Therapy Treatment/Reassessment   Patient Details  Name: Elizabeth Miranda MRN: [id number] Date of Birth: 1951-05-14 Referring Provider: Celene Squibb   Encounter Date: 04/15/2016      PT End of Session - 04/15/16 1525    Visit Number 5   Number of Visits 6   Date for PT Re-Evaluation 04/24/16   Authorization Type Medicare trad   Authorization Time Period 03/13/16-04/24/16   Authorization - Visit Number 5   Authorization - Number of Visits 10   PT Start Time [id number]  pt arrived late   PT Stop Time 1522   PT Time Calculation (min) 34 min   Activity Tolerance Patient tolerated treatment well;No increased pain   Behavior During Therapy WFL for tasks assessed/performed      Past Medical History:  Diagnosis Date  . Hypothyroid     Past Surgical History:  Procedure Laterality Date  . APPENDECTOMY    . complete hysterectomy'    . PARTIAL HYSTERECTOMY      There were no vitals filed for this visit.      Subjective Assessment - 04/15/16 1449    Subjective Pt reports that she feels she is improving with her hip strength. She noticed that she was able to climb a ladder the other day but was hardly able to climb the last step due to fatigue. She has been doing her exercises without any issues.    Pertinent History no prior history.    Currently in Pain? Yes   Pain Score 4    Pain Location Buttocks   Pain Orientation Right   Pain Radiating Towards none    Pain Onset More than a month ago            [facility] PT Assessment - 04/15/16 0001      PROM   Overall PROM Comments ABD: Rt 30 deg, Lt: 35deg, (after correcting hip ER, Flexion compensation), pain free BLE                     [facility] Adult PT Treatment/Exercise - 04/15/16 0001      Knee/Hip Exercises: Supine   Single Leg Bridge Both;1 set;20 reps;Other  (comment)  contralat knee to chest, increased difficutly Lt>Rt   Other Supine Knee/Hip Exercises Bridge hold with alt knee ext x10 reps   Other Supine Knee/Hip Exercises Unilateral trunk derotation with leg lowering x8 reps each with tactile/verbal cues for improved technique     Knee/Hip Exercises: Sidelying   Hip ABduction Both;1 set;20 reps   Hip ABduction Limitations against wall with simultaneous hip extensor activation                PT Education - 04/15/16 1531    Education provided Yes   Education Details difference between strength and stability during therex performed during session; encouraged continued HEP adherence with update to 1 exercise that no longer challenges pt.   Methods Explanation;Demonstration;Verbal cues;Handout   Comprehension Verbalized understanding;Returned demonstration          PT Short Term Goals - 03/13/16 2125      PT SHORT TERM GOAL #1   Title After 4 weeks patient will report a 50% increase in tolerated walking time during weekly exercise.    Status New     PT SHORT TERM GOAL #2   Title After 4 weeks patient will  report <2/10 pain with palpation of bilat gluteal musculature to demonstrate improved fascial restrictions.    Status New     PT SHORT TERM GOAL #3   Title After 4 weeks patient will demonstrate sidelying hip abduction of 15x without stopping to demonstrate improved hip strength.            PT Long Term Goals - 03/13/16 2127      PT LONG TERM GOAL #1   Title After 6 weeks patient will report 100% increase in tolerated walking time during exercise.    Status New     PT LONG TERM GOAL #2   Title After 6 weeks patient will report 0/10 pain with ascending/descending <12 steps.    Status New     PT LONG TERM GOAL #3   Title After 6 weeks patient will demonstrate indepdence in advanced home exercise program to continue hip strengthening after dischrge.    Status New               Plan - 06-May-2016 1526     Clinical Impression Statement Pt arrived late to today's session, reporting improvement in overall function and strength since beginning PT. She was able to perform all exercises introduced during today's session, noting trunk rotation with bridge hold secondary to mild limitations in hip stability which decreased some after verbal cues. At this time, her strength appears to be much improved from her initial evaluation, except hip extensor strength in more of an end range lengthening position (half kneel to stand) which should continue to improve with HEP updates and activity performed during her sessions. Will continue with current POC.   Rehab Potential Excellent   Clinical Impairments Affecting Rehab Potential chronicity suspicious for degenerative tendinosis.    PT Frequency 1x / week   PT Duration 6 weeks   PT Treatment/Interventions ADLs/Self Care Home Management;Moist Heat;Electrical Stimulation;Gait training;Stair training;Functional mobility training;Therapeutic activities;Therapeutic exercise;Balance training;Manual techniques;Patient/family education;Dry needling;Passive range of motion   PT Next Visit Plan Continue to progress glute med strength and shift to endurance; hip extensor strength in more lengthened positions; assess Lt hip flexor length for possible limitation compared to Rt   PT Home Exercise Plan 03/13/16: Clam band, reverse clam, sidelying hip abduction: 10/13: SLS balance, Supine hip ER stretch; 2023-05-07: SL bridge    Consulted and Agree with Plan of Care Patient      Patient will benefit from skilled therapeutic intervention in order to improve the following deficits and impairments:  Abnormal gait, Increased fascial restricitons, Pain, Postural dysfunction, Increased muscle spasms, Hypermobility, Decreased activity tolerance, Hypomobility, Decreased strength  Visit Diagnosis: Pain in right hip  Pain in left hip  Muscle weakness (generalized)       G-Codes - 2016/05/06  1529    Functional Assessment Tool Used Clinical Judgement based on reports of activity tolerance, strength and pain report    Functional Limitation Mobility: Walking and moving around   Mobility: Walking and Moving Around Current Status [id number]) At least 1 percent but less than 20 percent impaired, limited or restricted   Mobility: Walking and Moving Around Goal Status [phone]) At least 1 percent but less than 20 percent impaired, limited or restricted      Problem List There are no active problems to display for this patient.   3:40 PM,05-06-16 Elly Modena PT, DPT [facility] [address] [place], [place], [address] Phone: [phone]   Fax:  [phone]  Name: Elizabeth Miranda MRN: [id number] Date of Birth: Oct 20, 1950

## 2016-04-29 ENCOUNTER — Ambulatory Visit (HOSPITAL_COMMUNITY): Payer: Medicare Other | Attending: Internal Medicine

## 2016-04-29 DIAGNOSIS — M6281 Muscle weakness (generalized): Secondary | ICD-10-CM

## 2016-04-29 DIAGNOSIS — M25551 Pain in right hip: Secondary | ICD-10-CM

## 2016-04-29 DIAGNOSIS — M25552 Pain in left hip: Secondary | ICD-10-CM | POA: Diagnosis not present

## 2016-04-29 NOTE — Therapy (Signed)
PHYSICAL THERAPY DISCHARGE SUMMARY  Visits from Start of Care: 6  Current functional level related to goals / functional outcomes: *see below   Remaining deficits: *see below   Education / Equipment: *see below Plan: Patient agrees to discharge.  Patient goals were met. Patient is being discharged due to being pleased with the current functional level.  ?????         3:58 PM, 04/29/16 Etta Grandchild, PT, DPT Physical Therapist at [facility] [phone] (office)        [place], [place], [address] Phone: [phone]   Fax:  [phone]  Physical Therapy Treatment  Patient Details  Name: Elizabeth Miranda MRN: [id number] Date of Birth: 19-Nov-1950 Referring Provider: Celene Squibb   Encounter Date: 04/29/2016      PT End of Session - 04/29/16 1548    Visit Number 6   Number of Visits 6   Date for PT Re-Evaluation 04/24/16   Authorization Type Medicare trad   Authorization Time Period 03/13/16-04/24/16; 04/24/16-04/30/16   Authorization - Visit Number 6   Authorization - Number of Visits 10   PT Start Time 4827   PT Stop Time 1536   PT Time Calculation (min) 63 min   Equipment Utilized During Treatment Gait belt   Activity Tolerance Patient tolerated treatment well;No increased pain   Behavior During Therapy WFL for tasks assessed/performed      Past Medical History:  Diagnosis Date  . Hypothyroid     Past Surgical History:  Procedure Laterality Date  . APPENDECTOMY    . complete hysterectomy'    . PARTIAL HYSTERECTOMY      There were no vitals filed for this visit.      Subjective Assessment - 04/29/16 1439    Subjective Pt reports that she continues to make progress. She says that she has noted big imporvements in pain and the ability to walk long distances.    Pertinent History no prior history.    How long can you sit comfortably? About 2  hours;    How long can you stand comfortably? No a limitation.    How long can you walk comfortably? About 1 hour; previously abotu 10 minutes.    Diagnostic tests MRI, showing glute med tendon changes and Lt hamstrings tendonosis   Currently in Pain? No/denies            [facility] PT Assessment - 04/29/16 0001      Assessment   Medical Diagnosis R/L lateral hip pain   Referring Provider Celene Squibb    Onset Date/Surgical Date --  >1 year ago   Hand Dominance Right   Next MD Visit None   Prior Therapy Chiro (TENS, ultrasound); Massage (c short term relief)      Balance Screen   Has the patient fallen in the past 6 months No   Has the patient had a decrease in activity level because of a fear of falling?  No   Is the patient reluctant to leave their home because of a fear of falling?  No     Prior Function   Level of Independence Independent     PROM   Overall PROM Comments ABD: Rt 30 deg, Lt: 35deg, (after correcting hip ER, Flexion compensation), pain free BLE  Taken on 04/15/16     Strength   Right Hip Extension 4+/5  HS 4/5; GMax 5/5 (range limited) tested  in standing   Right Hip External Rotation  5/5   Right Hip Internal Rotation 5/5   Right Hip ABduction 3+/5  Seated: 5/5, TFL: 3+/5 (painful), GMed: 3+/5 (no change)    Right Hip ADduction 5/5   Left Hip Extension 4+/5  HS 4/5; GMax 5/5 (range limited) tested in standing   Left Hip External Rotation 5/5  (4+/5 and painful at evaluation)   Left Hip Internal Rotation 5/5   Left Hip ABduction 4-/5  (Seated: 5/5, TFL(Seated: 5/5, TFL: 4+/5, GMed: 4/5   Left Hip ADduction 5/5     Flexibility   Soft Tissue Assessment /Muscle Length yes   Hamstrings Thomas Test: Psoas Length limited   12 degrees Lt, 7 degrees Rt                     [facility] Adult PT Treatment/Exercise - 04/29/16 0001      Knee/Hip Exercises: Stretches   Other Knee/Hip Stretches SKTC c leg hang for Psoas stretch 1x30sec bilat (HEP  update)   Reverse Clam: 1x10 bilat (HEP review)    Other Knee/Hip Stretches Hamstrings Bridge 1x10 (HEP update)   Lateral Step Up: 1x8, 8" step bilat; 1x8, 8" step bilat     Knee/Hip Exercises: Standing   SLS 1x30sec bilat, 3# cuff on ankle (HEP update)   Other Standing Knee Exercises lateral side stepping: 4x40fet redTB at ankles (HEP update)  Single Leg Eccentric Bridge 1x5bilat (HEP update)                PT Education - 04/29/16 1546    Education provided Yes   Education Details New HEP updates; reviewed old HEP; Explained relationship between function and pain in muscle and tendon of glute med.    Person(s) Educated Patient   Methods Explanation;Demonstration   Comprehension Verbalized understanding;Returned demonstration          PT Short Term Goals - 04/29/16 1500      PT SHORT TERM GOAL #1   Title After 4 weeks patient will report a 50% increase in tolerated walking time during weekly exercise.    Status Achieved     PT SHORT TERM GOAL #2   Title After 4 weeks patient will report <2/10 pain with palpation of bilat gluteal musculature to demonstrate improved fascial restrictions.    Status Partially Met     PT SHORT TERM GOAL #3   Title After 4 weeks patient will demonstrate sidelying hip abduction of 15x without stopping to demonstrate improved hip strength.    Status Achieved           PT Long Term Goals - 04/29/16 1502      PT LONG TERM GOAL #1   Title After 6 weeks patient will report 100% increase in tolerated walking time during exercise.    Status Achieved     PT LONG TERM GOAL #2   Title After 6 weeks patient will report 0/10 pain with ascending/descending <12 steps.    Baseline Pt reports improved strength, but continues to have pain.    Status Partially Met     PT LONG TERM GOAL #3   Title After 6 weeks patient will demonstrate indepdence in advanced home exercise program to continue hip strengthening after dischrge.    Status Achieved                Plan - 04/29/16 1550    Clinical Impression Statement Reassessment and DC today. Pt demonstrating achievement of most goals,  long term and short term. Pt demonstrating improvement in strength, functional activity tolerance, balance, and pain management. Pt has returned to leisure activities with only mild soreness thereafter, continues to have some pain at night with sleeping on R side, and is limited with high exersion activies required at work. HEP is reviewed in full today and updated with instructions on how to progress as her strength improves. Pt is pleased with her progress and is confident in her ability to continue to improve her strength and pain independently after DC.    Rehab Potential Excellent   Clinical Impairments Affecting Rehab Potential chronicity suspicious for degenerative tendinosis.    PT Frequency 1x / week   PT Duration 6 weeks   PT Treatment/Interventions ADLs/Self Care Home Management;Moist Heat;Electrical Stimulation;Gait training;Stair training;Functional mobility training;Therapeutic activities;Therapeutic exercise;Balance training;Manual techniques;Patient/family education;Dry needling;Passive range of motion   Consulted and Agree with Plan of Care Patient      Patient will benefit from skilled therapeutic intervention in order to improve the following deficits and impairments:  Abnormal gait, Increased fascial restricitons, Pain, Postural dysfunction, Increased muscle spasms, Hypermobility, Decreased activity tolerance, Hypomobility, Decreased strength  Visit Diagnosis: Pain in right hip  Pain in left hip  Muscle weakness (generalized)       G-Codes - 05-13-16 1554    Functional Assessment Tool Used Clinical Judgement based on reports of activity tolerance, strength and pain report    Functional Limitation Mobility: Walking and moving around   Mobility: Walking and Moving Around Goal Status [phone]) At least 1 percent but less than 20  percent impaired, limited or restricted   Mobility: Walking and Moving Around Discharge Status [phone]) At least 1 percent but less than 20 percent impaired, limited or restricted      Problem List There are no active problems to display for this patient.  3:58 PM, May 13, 2016 Etta Grandchild, PT, DPT Physical Therapist at [facility] [phone] (office)     [facility] [address] [place], [place], [address] Phone: [phone]   Fax:  [phone]  Name: Elizabeth Miranda MRN: [id number] Date of Birth: [birth year]

## 2016-05-13 ENCOUNTER — Ambulatory Visit (HOSPITAL_COMMUNITY): Payer: Medicare Other

## 2016-05-22 DIAGNOSIS — N959 Unspecified menopausal and perimenopausal disorder: Secondary | ICD-10-CM | POA: Diagnosis not present

## 2016-05-22 DIAGNOSIS — E063 Autoimmune thyroiditis: Secondary | ICD-10-CM | POA: Diagnosis not present

## 2016-05-22 DIAGNOSIS — E782 Mixed hyperlipidemia: Secondary | ICD-10-CM | POA: Diagnosis not present

## 2016-05-22 DIAGNOSIS — E282 Polycystic ovarian syndrome: Secondary | ICD-10-CM | POA: Diagnosis not present

## 2016-06-04 DIAGNOSIS — S338XXA Sprain of other parts of lumbar spine and pelvis, initial encounter: Secondary | ICD-10-CM | POA: Diagnosis not present

## 2016-06-04 DIAGNOSIS — S73101A Unspecified sprain of right hip, initial encounter: Secondary | ICD-10-CM | POA: Diagnosis not present

## 2016-06-04 DIAGNOSIS — M7060 Trochanteric bursitis, unspecified hip: Secondary | ICD-10-CM | POA: Diagnosis not present

## 2016-06-04 DIAGNOSIS — M9903 Segmental and somatic dysfunction of lumbar region: Secondary | ICD-10-CM | POA: Diagnosis not present

## 2016-11-26 DIAGNOSIS — S73101A Unspecified sprain of right hip, initial encounter: Secondary | ICD-10-CM | POA: Diagnosis not present

## 2016-11-26 DIAGNOSIS — M7060 Trochanteric bursitis, unspecified hip: Secondary | ICD-10-CM | POA: Diagnosis not present

## 2016-11-26 DIAGNOSIS — S338XXA Sprain of other parts of lumbar spine and pelvis, initial encounter: Secondary | ICD-10-CM | POA: Diagnosis not present

## 2016-11-26 DIAGNOSIS — M9903 Segmental and somatic dysfunction of lumbar region: Secondary | ICD-10-CM | POA: Diagnosis not present

## 2016-11-28 DIAGNOSIS — M7060 Trochanteric bursitis, unspecified hip: Secondary | ICD-10-CM | POA: Diagnosis not present

## 2016-11-28 DIAGNOSIS — M9903 Segmental and somatic dysfunction of lumbar region: Secondary | ICD-10-CM | POA: Diagnosis not present

## 2016-11-28 DIAGNOSIS — S338XXA Sprain of other parts of lumbar spine and pelvis, initial encounter: Secondary | ICD-10-CM | POA: Diagnosis not present

## 2016-11-28 DIAGNOSIS — S73101A Unspecified sprain of right hip, initial encounter: Secondary | ICD-10-CM | POA: Diagnosis not present

## 2016-12-04 DIAGNOSIS — S338XXA Sprain of other parts of lumbar spine and pelvis, initial encounter: Secondary | ICD-10-CM | POA: Diagnosis not present

## 2016-12-04 DIAGNOSIS — S73101A Unspecified sprain of right hip, initial encounter: Secondary | ICD-10-CM | POA: Diagnosis not present

## 2016-12-04 DIAGNOSIS — M9903 Segmental and somatic dysfunction of lumbar region: Secondary | ICD-10-CM | POA: Diagnosis not present

## 2016-12-04 DIAGNOSIS — M7060 Trochanteric bursitis, unspecified hip: Secondary | ICD-10-CM | POA: Diagnosis not present

## 2016-12-09 DIAGNOSIS — M9903 Segmental and somatic dysfunction of lumbar region: Secondary | ICD-10-CM | POA: Diagnosis not present

## 2016-12-09 DIAGNOSIS — M7060 Trochanteric bursitis, unspecified hip: Secondary | ICD-10-CM | POA: Diagnosis not present

## 2016-12-09 DIAGNOSIS — S73101A Unspecified sprain of right hip, initial encounter: Secondary | ICD-10-CM | POA: Diagnosis not present

## 2016-12-09 DIAGNOSIS — S338XXA Sprain of other parts of lumbar spine and pelvis, initial encounter: Secondary | ICD-10-CM | POA: Diagnosis not present

## 2016-12-26 DIAGNOSIS — M9903 Segmental and somatic dysfunction of lumbar region: Secondary | ICD-10-CM | POA: Diagnosis not present

## 2016-12-26 DIAGNOSIS — M7060 Trochanteric bursitis, unspecified hip: Secondary | ICD-10-CM | POA: Diagnosis not present

## 2016-12-26 DIAGNOSIS — S338XXA Sprain of other parts of lumbar spine and pelvis, initial encounter: Secondary | ICD-10-CM | POA: Diagnosis not present

## 2016-12-26 DIAGNOSIS — S73101A Unspecified sprain of right hip, initial encounter: Secondary | ICD-10-CM | POA: Diagnosis not present

## 2016-12-31 DIAGNOSIS — S73101A Unspecified sprain of right hip, initial encounter: Secondary | ICD-10-CM | POA: Diagnosis not present

## 2016-12-31 DIAGNOSIS — M9903 Segmental and somatic dysfunction of lumbar region: Secondary | ICD-10-CM | POA: Diagnosis not present

## 2016-12-31 DIAGNOSIS — S338XXA Sprain of other parts of lumbar spine and pelvis, initial encounter: Secondary | ICD-10-CM | POA: Diagnosis not present

## 2016-12-31 DIAGNOSIS — M7060 Trochanteric bursitis, unspecified hip: Secondary | ICD-10-CM | POA: Diagnosis not present

## 2017-01-02 DIAGNOSIS — S73101A Unspecified sprain of right hip, initial encounter: Secondary | ICD-10-CM | POA: Diagnosis not present

## 2017-01-02 DIAGNOSIS — S338XXA Sprain of other parts of lumbar spine and pelvis, initial encounter: Secondary | ICD-10-CM | POA: Diagnosis not present

## 2017-01-02 DIAGNOSIS — M9903 Segmental and somatic dysfunction of lumbar region: Secondary | ICD-10-CM | POA: Diagnosis not present

## 2017-01-02 DIAGNOSIS — M7060 Trochanteric bursitis, unspecified hip: Secondary | ICD-10-CM | POA: Diagnosis not present

## 2017-01-06 DIAGNOSIS — S338XXA Sprain of other parts of lumbar spine and pelvis, initial encounter: Secondary | ICD-10-CM | POA: Diagnosis not present

## 2017-01-06 DIAGNOSIS — M9903 Segmental and somatic dysfunction of lumbar region: Secondary | ICD-10-CM | POA: Diagnosis not present

## 2017-01-06 DIAGNOSIS — S73101A Unspecified sprain of right hip, initial encounter: Secondary | ICD-10-CM | POA: Diagnosis not present

## 2017-01-06 DIAGNOSIS — M7060 Trochanteric bursitis, unspecified hip: Secondary | ICD-10-CM | POA: Diagnosis not present

## 2017-01-09 DIAGNOSIS — S338XXA Sprain of other parts of lumbar spine and pelvis, initial encounter: Secondary | ICD-10-CM | POA: Diagnosis not present

## 2017-01-09 DIAGNOSIS — M7060 Trochanteric bursitis, unspecified hip: Secondary | ICD-10-CM | POA: Diagnosis not present

## 2017-01-09 DIAGNOSIS — S73101A Unspecified sprain of right hip, initial encounter: Secondary | ICD-10-CM | POA: Diagnosis not present

## 2017-01-09 DIAGNOSIS — M9903 Segmental and somatic dysfunction of lumbar region: Secondary | ICD-10-CM | POA: Diagnosis not present

## 2017-01-16 DIAGNOSIS — S73101A Unspecified sprain of right hip, initial encounter: Secondary | ICD-10-CM | POA: Diagnosis not present

## 2017-01-16 DIAGNOSIS — M7060 Trochanteric bursitis, unspecified hip: Secondary | ICD-10-CM | POA: Diagnosis not present

## 2017-01-16 DIAGNOSIS — S338XXA Sprain of other parts of lumbar spine and pelvis, initial encounter: Secondary | ICD-10-CM | POA: Diagnosis not present

## 2017-01-16 DIAGNOSIS — M9903 Segmental and somatic dysfunction of lumbar region: Secondary | ICD-10-CM | POA: Diagnosis not present

## 2017-01-21 DIAGNOSIS — M9903 Segmental and somatic dysfunction of lumbar region: Secondary | ICD-10-CM | POA: Diagnosis not present

## 2017-01-21 DIAGNOSIS — M7060 Trochanteric bursitis, unspecified hip: Secondary | ICD-10-CM | POA: Diagnosis not present

## 2017-01-21 DIAGNOSIS — S338XXA Sprain of other parts of lumbar spine and pelvis, initial encounter: Secondary | ICD-10-CM | POA: Diagnosis not present

## 2017-01-21 DIAGNOSIS — S73101A Unspecified sprain of right hip, initial encounter: Secondary | ICD-10-CM | POA: Diagnosis not present

## 2017-01-27 DIAGNOSIS — S338XXA Sprain of other parts of lumbar spine and pelvis, initial encounter: Secondary | ICD-10-CM | POA: Diagnosis not present

## 2017-01-27 DIAGNOSIS — M7060 Trochanteric bursitis, unspecified hip: Secondary | ICD-10-CM | POA: Diagnosis not present

## 2017-01-27 DIAGNOSIS — S73101A Unspecified sprain of right hip, initial encounter: Secondary | ICD-10-CM | POA: Diagnosis not present

## 2017-01-27 DIAGNOSIS — M9903 Segmental and somatic dysfunction of lumbar region: Secondary | ICD-10-CM | POA: Diagnosis not present

## 2017-06-26 DIAGNOSIS — S73101A Unspecified sprain of right hip, initial encounter: Secondary | ICD-10-CM | POA: Diagnosis not present

## 2017-06-26 DIAGNOSIS — M7060 Trochanteric bursitis, unspecified hip: Secondary | ICD-10-CM | POA: Diagnosis not present

## 2017-06-26 DIAGNOSIS — S338XXA Sprain of other parts of lumbar spine and pelvis, initial encounter: Secondary | ICD-10-CM | POA: Diagnosis not present

## 2017-06-26 DIAGNOSIS — M9903 Segmental and somatic dysfunction of lumbar region: Secondary | ICD-10-CM | POA: Diagnosis not present

## 2017-06-27 DIAGNOSIS — S338XXA Sprain of other parts of lumbar spine and pelvis, initial encounter: Secondary | ICD-10-CM | POA: Diagnosis not present

## 2017-06-27 DIAGNOSIS — S73101A Unspecified sprain of right hip, initial encounter: Secondary | ICD-10-CM | POA: Diagnosis not present

## 2017-06-27 DIAGNOSIS — M7060 Trochanteric bursitis, unspecified hip: Secondary | ICD-10-CM | POA: Diagnosis not present

## 2017-06-27 DIAGNOSIS — M9903 Segmental and somatic dysfunction of lumbar region: Secondary | ICD-10-CM | POA: Diagnosis not present

## 2017-06-30 DIAGNOSIS — S338XXA Sprain of other parts of lumbar spine and pelvis, initial encounter: Secondary | ICD-10-CM | POA: Diagnosis not present

## 2017-06-30 DIAGNOSIS — M7060 Trochanteric bursitis, unspecified hip: Secondary | ICD-10-CM | POA: Diagnosis not present

## 2017-06-30 DIAGNOSIS — M9903 Segmental and somatic dysfunction of lumbar region: Secondary | ICD-10-CM | POA: Diagnosis not present

## 2017-06-30 DIAGNOSIS — S73101A Unspecified sprain of right hip, initial encounter: Secondary | ICD-10-CM | POA: Diagnosis not present

## 2017-07-04 DIAGNOSIS — S73101A Unspecified sprain of right hip, initial encounter: Secondary | ICD-10-CM | POA: Diagnosis not present

## 2017-07-04 DIAGNOSIS — S338XXA Sprain of other parts of lumbar spine and pelvis, initial encounter: Secondary | ICD-10-CM | POA: Diagnosis not present

## 2017-07-04 DIAGNOSIS — M9903 Segmental and somatic dysfunction of lumbar region: Secondary | ICD-10-CM | POA: Diagnosis not present

## 2017-07-04 DIAGNOSIS — M7060 Trochanteric bursitis, unspecified hip: Secondary | ICD-10-CM | POA: Diagnosis not present

## 2017-07-07 DIAGNOSIS — S338XXA Sprain of other parts of lumbar spine and pelvis, initial encounter: Secondary | ICD-10-CM | POA: Diagnosis not present

## 2017-07-07 DIAGNOSIS — M7060 Trochanteric bursitis, unspecified hip: Secondary | ICD-10-CM | POA: Diagnosis not present

## 2017-07-07 DIAGNOSIS — M9903 Segmental and somatic dysfunction of lumbar region: Secondary | ICD-10-CM | POA: Diagnosis not present

## 2017-07-07 DIAGNOSIS — S73101A Unspecified sprain of right hip, initial encounter: Secondary | ICD-10-CM | POA: Diagnosis not present

## 2017-07-09 DIAGNOSIS — N959 Unspecified menopausal and perimenopausal disorder: Secondary | ICD-10-CM | POA: Diagnosis not present

## 2017-07-09 DIAGNOSIS — E063 Autoimmune thyroiditis: Secondary | ICD-10-CM | POA: Diagnosis not present

## 2017-07-09 DIAGNOSIS — E782 Mixed hyperlipidemia: Secondary | ICD-10-CM | POA: Diagnosis not present

## 2017-07-09 DIAGNOSIS — E559 Vitamin D deficiency, unspecified: Secondary | ICD-10-CM | POA: Diagnosis not present

## 2017-07-09 DIAGNOSIS — E282 Polycystic ovarian syndrome: Secondary | ICD-10-CM | POA: Diagnosis not present

## 2017-07-10 DIAGNOSIS — S338XXA Sprain of other parts of lumbar spine and pelvis, initial encounter: Secondary | ICD-10-CM | POA: Diagnosis not present

## 2017-07-10 DIAGNOSIS — S73101A Unspecified sprain of right hip, initial encounter: Secondary | ICD-10-CM | POA: Diagnosis not present

## 2017-07-10 DIAGNOSIS — M9903 Segmental and somatic dysfunction of lumbar region: Secondary | ICD-10-CM | POA: Diagnosis not present

## 2017-07-10 DIAGNOSIS — M7060 Trochanteric bursitis, unspecified hip: Secondary | ICD-10-CM | POA: Diagnosis not present

## 2017-07-15 DIAGNOSIS — M7060 Trochanteric bursitis, unspecified hip: Secondary | ICD-10-CM | POA: Diagnosis not present

## 2017-07-15 DIAGNOSIS — M9903 Segmental and somatic dysfunction of lumbar region: Secondary | ICD-10-CM | POA: Diagnosis not present

## 2017-07-15 DIAGNOSIS — S73101A Unspecified sprain of right hip, initial encounter: Secondary | ICD-10-CM | POA: Diagnosis not present

## 2017-07-15 DIAGNOSIS — S338XXA Sprain of other parts of lumbar spine and pelvis, initial encounter: Secondary | ICD-10-CM | POA: Diagnosis not present

## 2017-07-18 DIAGNOSIS — S73101A Unspecified sprain of right hip, initial encounter: Secondary | ICD-10-CM | POA: Diagnosis not present

## 2017-07-18 DIAGNOSIS — S338XXA Sprain of other parts of lumbar spine and pelvis, initial encounter: Secondary | ICD-10-CM | POA: Diagnosis not present

## 2017-07-18 DIAGNOSIS — M7060 Trochanteric bursitis, unspecified hip: Secondary | ICD-10-CM | POA: Diagnosis not present

## 2017-07-18 DIAGNOSIS — M9903 Segmental and somatic dysfunction of lumbar region: Secondary | ICD-10-CM | POA: Diagnosis not present

## 2017-07-20 DIAGNOSIS — J40 Bronchitis, not specified as acute or chronic: Secondary | ICD-10-CM | POA: Diagnosis not present

## 2017-09-01 DIAGNOSIS — M84375A Stress fracture, left foot, initial encounter for fracture: Secondary | ICD-10-CM | POA: Diagnosis not present

## 2017-09-01 DIAGNOSIS — M19072 Primary osteoarthritis, left ankle and foot: Secondary | ICD-10-CM | POA: Diagnosis not present

## 2017-09-01 DIAGNOSIS — M19071 Primary osteoarthritis, right ankle and foot: Secondary | ICD-10-CM | POA: Diagnosis not present

## 2017-09-01 DIAGNOSIS — M7752 Other enthesopathy of left foot: Secondary | ICD-10-CM | POA: Diagnosis not present

## 2017-09-22 DIAGNOSIS — M84375D Stress fracture, left foot, subsequent encounter for fracture with routine healing: Secondary | ICD-10-CM | POA: Diagnosis not present

## 2018-05-11 ENCOUNTER — Other Ambulatory Visit (HOSPITAL_COMMUNITY): Payer: Self-pay | Admitting: Internal Medicine

## 2018-05-11 ENCOUNTER — Ambulatory Visit (HOSPITAL_COMMUNITY)
Admission: RE | Admit: 2018-05-11 | Discharge: 2018-05-11 | Disposition: A | Discharge status: Home / Self Care | Payer: Medicare Other | Source: Ambulatory Visit | Attending: Internal Medicine | Admitting: Internal Medicine

## 2018-05-11 DIAGNOSIS — N200 Calculus of kidney: Secondary | ICD-10-CM

## 2018-05-11 DIAGNOSIS — R030 Elevated blood-pressure reading, without diagnosis of hypertension: Secondary | ICD-10-CM | POA: Diagnosis not present

## 2018-05-11 DIAGNOSIS — R109 Unspecified abdominal pain: Secondary | ICD-10-CM | POA: Diagnosis not present

## 2018-05-11 DIAGNOSIS — R3915 Urgency of urination: Secondary | ICD-10-CM | POA: Diagnosis not present

## 2018-05-11 DIAGNOSIS — R1010 Upper abdominal pain, unspecified: Secondary | ICD-10-CM | POA: Diagnosis not present

## 2018-05-11 DIAGNOSIS — Z2821 Immunization not carried out because of patient refusal: Secondary | ICD-10-CM | POA: Diagnosis not present

## 2018-08-31 DIAGNOSIS — E282 Polycystic ovarian syndrome: Secondary | ICD-10-CM | POA: Diagnosis not present

## 2018-08-31 DIAGNOSIS — E063 Autoimmune thyroiditis: Secondary | ICD-10-CM | POA: Diagnosis not present

## 2018-08-31 DIAGNOSIS — N959 Unspecified menopausal and perimenopausal disorder: Secondary | ICD-10-CM | POA: Diagnosis not present

## 2018-08-31 DIAGNOSIS — E782 Mixed hyperlipidemia: Secondary | ICD-10-CM | POA: Diagnosis not present

## 2018-08-31 DIAGNOSIS — H01005 Unspecified blepharitis left lower eyelid: Secondary | ICD-10-CM | POA: Diagnosis not present

## 2018-08-31 DIAGNOSIS — E039 Hypothyroidism, unspecified: Secondary | ICD-10-CM | POA: Diagnosis not present

## 2018-08-31 DIAGNOSIS — H01002 Unspecified blepharitis right lower eyelid: Secondary | ICD-10-CM | POA: Diagnosis not present

## 2018-08-31 DIAGNOSIS — H01004 Unspecified blepharitis left upper eyelid: Secondary | ICD-10-CM | POA: Diagnosis not present

## 2018-08-31 DIAGNOSIS — H01001 Unspecified blepharitis right upper eyelid: Secondary | ICD-10-CM | POA: Diagnosis not present

## 2019-01-20 DIAGNOSIS — M25569 Pain in unspecified knee: Secondary | ICD-10-CM | POA: Diagnosis not present

## 2019-01-20 DIAGNOSIS — R600 Localized edema: Secondary | ICD-10-CM | POA: Diagnosis not present

## 2019-01-21 ENCOUNTER — Other Ambulatory Visit: Payer: Self-pay | Admitting: Internal Medicine

## 2019-01-21 ENCOUNTER — Other Ambulatory Visit (HOSPITAL_COMMUNITY): Payer: Self-pay | Admitting: Internal Medicine

## 2019-01-21 DIAGNOSIS — R2242 Localized swelling, mass and lump, left lower limb: Secondary | ICD-10-CM

## 2019-01-22 ENCOUNTER — Other Ambulatory Visit: Payer: Self-pay

## 2019-01-22 ENCOUNTER — Ambulatory Visit (HOSPITAL_COMMUNITY)
Admission: RE | Admit: 2019-01-22 | Discharge: 2019-01-22 | Disposition: A | Discharge status: Home / Self Care | Payer: Medicare Other | Source: Ambulatory Visit | Attending: Internal Medicine | Admitting: Internal Medicine

## 2019-01-22 DIAGNOSIS — M79662 Pain in left lower leg: Secondary | ICD-10-CM | POA: Diagnosis not present

## 2019-01-22 DIAGNOSIS — R2242 Localized swelling, mass and lump, left lower limb: Secondary | ICD-10-CM

## 2019-01-22 DIAGNOSIS — M7989 Other specified soft tissue disorders: Secondary | ICD-10-CM | POA: Diagnosis not present

## 2019-03-18 ENCOUNTER — Other Ambulatory Visit: Payer: Self-pay

## 2019-03-18 ENCOUNTER — Other Ambulatory Visit (HOSPITAL_COMMUNITY): Payer: Self-pay | Admitting: Nurse Practitioner

## 2019-03-18 ENCOUNTER — Ambulatory Visit (HOSPITAL_COMMUNITY)
Admission: RE | Admit: 2019-03-18 | Discharge: 2019-03-18 | Disposition: A | Discharge status: Home / Self Care | Payer: Medicare Other | Source: Ambulatory Visit | Attending: Nurse Practitioner | Admitting: Nurse Practitioner

## 2019-03-18 DIAGNOSIS — R05 Cough: Secondary | ICD-10-CM | POA: Diagnosis not present

## 2019-03-18 DIAGNOSIS — R0602 Shortness of breath: Secondary | ICD-10-CM | POA: Diagnosis not present

## 2019-03-18 DIAGNOSIS — R059 Cough, unspecified: Secondary | ICD-10-CM

## 2019-03-18 DIAGNOSIS — J060 Acute laryngopharyngitis: Secondary | ICD-10-CM | POA: Diagnosis not present

## 2019-03-18 DIAGNOSIS — R509 Fever, unspecified: Secondary | ICD-10-CM | POA: Diagnosis not present

## 2019-05-20 DIAGNOSIS — M9903 Segmental and somatic dysfunction of lumbar region: Secondary | ICD-10-CM | POA: Diagnosis not present

## 2019-05-20 DIAGNOSIS — M9901 Segmental and somatic dysfunction of cervical region: Secondary | ICD-10-CM | POA: Diagnosis not present

## 2019-05-20 DIAGNOSIS — M9902 Segmental and somatic dysfunction of thoracic region: Secondary | ICD-10-CM | POA: Diagnosis not present

## 2019-05-21 DIAGNOSIS — M9902 Segmental and somatic dysfunction of thoracic region: Secondary | ICD-10-CM | POA: Diagnosis not present

## 2019-05-21 DIAGNOSIS — M9903 Segmental and somatic dysfunction of lumbar region: Secondary | ICD-10-CM | POA: Diagnosis not present

## 2019-05-21 DIAGNOSIS — M9901 Segmental and somatic dysfunction of cervical region: Secondary | ICD-10-CM | POA: Diagnosis not present

## 2019-05-24 DIAGNOSIS — M9903 Segmental and somatic dysfunction of lumbar region: Secondary | ICD-10-CM | POA: Diagnosis not present

## 2019-05-24 DIAGNOSIS — M9902 Segmental and somatic dysfunction of thoracic region: Secondary | ICD-10-CM | POA: Diagnosis not present

## 2019-05-24 DIAGNOSIS — M9901 Segmental and somatic dysfunction of cervical region: Secondary | ICD-10-CM | POA: Diagnosis not present

## 2019-05-25 DIAGNOSIS — M9901 Segmental and somatic dysfunction of cervical region: Secondary | ICD-10-CM | POA: Diagnosis not present

## 2019-05-25 DIAGNOSIS — M9902 Segmental and somatic dysfunction of thoracic region: Secondary | ICD-10-CM | POA: Diagnosis not present

## 2019-05-25 DIAGNOSIS — M9903 Segmental and somatic dysfunction of lumbar region: Secondary | ICD-10-CM | POA: Diagnosis not present

## 2019-05-27 DIAGNOSIS — M9901 Segmental and somatic dysfunction of cervical region: Secondary | ICD-10-CM | POA: Diagnosis not present

## 2019-05-27 DIAGNOSIS — M9903 Segmental and somatic dysfunction of lumbar region: Secondary | ICD-10-CM | POA: Diagnosis not present

## 2019-05-27 DIAGNOSIS — M9902 Segmental and somatic dysfunction of thoracic region: Secondary | ICD-10-CM | POA: Diagnosis not present

## 2019-05-31 DIAGNOSIS — M9902 Segmental and somatic dysfunction of thoracic region: Secondary | ICD-10-CM | POA: Diagnosis not present

## 2019-05-31 DIAGNOSIS — M9903 Segmental and somatic dysfunction of lumbar region: Secondary | ICD-10-CM | POA: Diagnosis not present

## 2019-05-31 DIAGNOSIS — M9901 Segmental and somatic dysfunction of cervical region: Secondary | ICD-10-CM | POA: Diagnosis not present

## 2019-07-14 ENCOUNTER — Other Ambulatory Visit: Payer: Self-pay

## 2019-07-14 ENCOUNTER — Encounter (HOSPITAL_COMMUNITY): Payer: Self-pay | Admitting: Emergency Medicine

## 2019-07-14 DIAGNOSIS — Z79899 Other long term (current) drug therapy: Secondary | ICD-10-CM | POA: Diagnosis not present

## 2019-07-14 DIAGNOSIS — R600 Localized edema: Secondary | ICD-10-CM | POA: Diagnosis not present

## 2019-07-14 DIAGNOSIS — R2242 Localized swelling, mass and lump, left lower limb: Secondary | ICD-10-CM | POA: Insufficient documentation

## 2019-07-14 DIAGNOSIS — E039 Hypothyroidism, unspecified: Secondary | ICD-10-CM | POA: Diagnosis not present

## 2019-07-14 DIAGNOSIS — M7989 Other specified soft tissue disorders: Secondary | ICD-10-CM | POA: Diagnosis not present

## 2019-07-14 NOTE — ED Triage Notes (Signed)
Pt states she "has a place on her L. Leg" that she thought was some sort of spider bite but is now concerned it may be a blood clot as it is swollen and painful.

## 2019-07-15 ENCOUNTER — Emergency Department (HOSPITAL_COMMUNITY): Payer: Medicare Other

## 2019-07-15 ENCOUNTER — Emergency Department (HOSPITAL_COMMUNITY)
Admission: EM | Admit: 2019-07-15 | Discharge: 2019-07-15 | Disposition: A | Discharge status: Home / Self Care | Payer: Medicare Other | Attending: Emergency Medicine | Admitting: Emergency Medicine

## 2019-07-15 DIAGNOSIS — M7989 Other specified soft tissue disorders: Secondary | ICD-10-CM | POA: Diagnosis not present

## 2019-07-15 DIAGNOSIS — R600 Localized edema: Secondary | ICD-10-CM

## 2019-07-15 LAB — URINALYSIS, ROUTINE W REFLEX MICROSCOPIC
Bilirubin Urine: NEGATIVE
Glucose, UA: NEGATIVE mg/dL
Hgb urine dipstick: NEGATIVE
Ketones, ur: NEGATIVE mg/dL
Leukocytes,Ua: NEGATIVE
Nitrite: NEGATIVE
Protein, ur: NEGATIVE mg/dL
Specific Gravity, Urine: 1.021 (ref 1.005–1.030)
pH: 7 (ref 5.0–8.0)

## 2019-07-15 LAB — CBC
HCT: 40.3 % (ref 36.0–46.0)
Hemoglobin: 13.1 g/dL (ref 12.0–15.0)
MCH: 30.5 pg (ref 26.0–34.0)
MCHC: 32.5 g/dL (ref 30.0–36.0)
MCV: 93.9 fL (ref 80.0–100.0)
Platelets: 344 10*3/uL (ref 150–400)
RBC: 4.29 MIL/uL (ref 3.87–5.11)
RDW: 13 % (ref 11.5–15.5)
WBC: 6.2 10*3/uL (ref 4.0–10.5)
nRBC: 0 % (ref 0.0–0.2)

## 2019-07-15 LAB — HEPATIC FUNCTION PANEL
ALT: 22 U/L (ref 0–44)
AST: 25 U/L (ref 15–41)
Albumin: 4.4 g/dL (ref 3.5–5.0)
Alkaline Phosphatase: 55 U/L (ref 38–126)
Bilirubin, Direct: <0.1 mg/dL (ref 0.0–0.2)
Total Bilirubin: 0.4 mg/dL (ref 0.3–1.2)
Total Protein: 7.9 g/dL (ref 6.5–8.1)

## 2019-07-15 LAB — BASIC METABOLIC PANEL
Anion gap: 8 (ref 5–15)
BUN: 17 mg/dL (ref 8–23)
CO2: 27 mmol/L (ref 22–32)
Calcium: 9.6 mg/dL (ref 8.9–10.3)
Chloride: 105 mmol/L (ref 98–111)
Creatinine, Ser: 0.69 mg/dL (ref 0.44–1.00)
GFR calc Af Amer: >60 mL/min (ref >60)
GFR calc non Af Amer: >60 mL/min (ref >60)
Glucose, Bld: 97 mg/dL (ref 70–99)
Potassium: 4.2 mmol/L (ref 3.5–5.1)
Sodium: 140 mmol/L (ref 135–145)

## 2019-07-15 LAB — D-DIMER, QUANTITATIVE: D-Dimer, Quant: 0.38 ug/mL-FEU (ref 0.00–0.50)

## 2019-07-15 LAB — BRAIN NATRIURETIC PEPTIDE: B Natriuretic Peptide: 10 pg/mL (ref 0.0–100.0)

## 2019-07-15 NOTE — Discharge Instructions (Addendum)
Keep leg elevated and follow up with your md next week

## 2019-07-15 NOTE — ED Provider Notes (Signed)
[facility] EMERGENCY DEPARTMENT Provider Note   CSN: [id number] Arrival date & time: 07/14/19  2222     History Chief Complaint  Patient presents with  . Leg Pain    Elizabeth Miranda is a 69 y.o. female.  Patient with history of hypothyroidism presents with left leg swelling and pain worsening today.  Patient had no fever over 100.4 degrees, no vomiting, no shortness of breath, no chest pain, no abdominal pain.  No history of similar.  Patient has no heart failure or blood clot history.  Patient denies recent surgery, active cancer, blood clot history.        Past Medical History:  Diagnosis Date  . Hypothyroid     There are no problems to display for this patient.   Past Surgical History:  Procedure Laterality Date  . APPENDECTOMY    . complete hysterectomy'    . PARTIAL HYSTERECTOMY       OB History   No obstetric history on file.     No family history on file.  Social History   Tobacco Use  . Smoking status: Never Smoker  . Smokeless tobacco: Never Used  Substance Use Topics  . Alcohol use: No  . Drug use: No    Home Medications Prior to Admission medications   Medication Sig Start Date End Date Taking? Authorizing Provider  B Complex-C (B-COMPLEX WITH VITAMIN C) tablet Take 1 tablet by mouth daily.    [provider]  BROMELAINS PO Take 1 capsule by mouth daily.    [provider]  ESTRADIOL PO Take 1.5 mg by mouth daily. Compound into a capsule.    [provider]  Estradiol-Estriol-Progesterone (BIEST/PROGESTERONE TD) Place onto the skin. 80/20 compound    [provider]  lactobacillus acidophilus (BACID) TABS tablet Take 2 tablets by mouth 3 (three) times daily.    [provider]  levothyroxine (SYNTHROID, LEVOTHROID) 100 MCG tablet Take 1 tablet by mouth daily. 06/29/14   [provider]  liothyronine (CYTOMEL) 5 MCG tablet Take 1 tablet by mouth daily. 06/29/14   [provider]   Multiple Vitamin (MULITIVITAMIN WITH MINERALS) TABS Take 1 tablet by mouth daily.    [provider]  Omega-3 Fatty Acids (OMEGA-3 PLUS PO) Take 2 capsules by mouth daily.    [provider]  polyethylene glycol-electrolytes (NULYTELY/GOLYTELY) 420 G solution Take 4,000 mLs by mouth once. 07/14/14   Setzer, Rona Ravens, NP  progesterone (PROMETRIUM) 200 MG capsule Take 200 mg by mouth daily.    [provider]  Testosterone 10 MG/ACT (2%) GEL Place onto the skin. Cream daily    [provider]    Allergies    Codeine and Sulfa antibiotics  Review of Systems   Review of Systems  Constitutional: Negative for chills and fever.  HENT: Negative for congestion.   Eyes: Negative for visual disturbance.  Respiratory: Negative for shortness of breath.   Cardiovascular: Positive for leg swelling. Negative for chest pain.  Gastrointestinal: Negative for abdominal pain and vomiting.  Genitourinary: Negative for dysuria and flank pain.  Musculoskeletal: Negative for back pain, neck pain and neck stiffness.  Skin: Negative for rash.  Neurological: Negative for light-headedness and headaches.    Physical Exam Updated Vital Signs BP (!) 170/78   Pulse 75   Resp 17   Ht 5\' 3"  (1.6 m)   Wt 68.9 kg   SpO2 97%   BMI 26.93 kg/m   Physical Exam Vitals and nursing note  reviewed.  Constitutional:      Appearance: She is well-developed.  HENT:     Head: Normocephalic and atraumatic.  Eyes:     General:        Right eye: No discharge.        Left eye: No discharge.     Conjunctiva/sclera: Conjunctivae normal.  Neck:     Trachea: No tracheal deviation.  Cardiovascular:     Rate and Rhythm: Normal rate.  Pulmonary:     Effort: Pulmonary effort is normal.  Abdominal:     General: There is no distension.     Palpations: Abdomen is soft.     Tenderness: There is no abdominal tenderness. There is no guarding.  Musculoskeletal:        General: Swelling and  tenderness present. Normal range of motion.     Cervical back: Normal range of motion and neck supple.     Comments: Patient has tenderness and swelling the left calf and lower extremity.  No external sign of infection.  Neurovascularly intact left leg.  No tenderness left thigh.  Skin:    General: Skin is warm.     Findings: No rash.  Neurological:     Mental Status: She is alert and oriented to person, place, and time.  Psychiatric:        Mood and Affect: Mood normal.     ED Results / Procedures / Treatments   Labs (all labs ordered are listed, but only abnormal results are displayed) Labs Reviewed  URINALYSIS, ROUTINE W REFLEX MICROSCOPIC - Abnormal; Notable for the following components:      Result Value   APPearance CLOUDY (*)    All other components within normal limits  CBC  D-DIMER, QUANTITATIVE (NOT AT [facility])  BASIC METABOLIC PANEL  BRAIN NATRIURETIC PEPTIDE  HEPATIC FUNCTION PANEL    EKG None  Radiology No results found.  Procedures Ultrasound ED DVT  Date/Time: 07/15/2019 4:48 AM Performed by: Elnora Morrison, MD Authorized by: Elnora Morrison, MD   Procedure details:    Indications: lower extremity pain     Assessment for:  DVT   Images Archived: Yes     Limitations:  Positioning LLE Findings:    Left common femoral vein:  Compressible   Left deep and superficial femoral veins:  Compressible   Left popliteal vein:  Compressible   (including critical care time)  Medications Ordered in ED Medications - No data to display  ED Course  I have reviewed the triage vital signs and the nursing notes.  Pertinent labs & imaging results that were available during my care of the patient were reviewed by me and considered in my medical decision making (see chart for details).    MDM Rules/Calculators/A&P                      Patient presents with left leg edema, compartments soft, neurovascularly intact and no sign of external infection.  Concern for  possible blood clot.  Unable to get official ultrasound during the night, bedside ultrasound done and no obvious clot seen at proximal femoral vein or popliteal.  Plan for blood work to check kidney function, D-dimer and patient will need official ultrasound later today. Blood work reviewed normal D-dimer, normal white blood cell count, normal liver and kidney function, overall unremarkable.  Patient care signed out to follow-up ultrasound results formal to look for occult DVT.  Patient blood pressure elevated in ER, patient understands to follow-up outpatient  to see if persistently high over the coming weeks.  Final Clinical Impression(s) / ED Diagnoses Final diagnoses:  Leg edema, left    Rx / DC Orders ED Discharge Orders    None       Elnora Morrison, MD 07/15/19 [phone]

## 2019-07-19 DIAGNOSIS — S93331A Other subluxation of right foot, initial encounter: Secondary | ICD-10-CM | POA: Diagnosis not present

## 2019-07-19 DIAGNOSIS — M79671 Pain in right foot: Secondary | ICD-10-CM | POA: Diagnosis not present

## 2019-07-19 DIAGNOSIS — M79674 Pain in right toe(s): Secondary | ICD-10-CM | POA: Diagnosis not present

## 2019-07-27 DIAGNOSIS — R600 Localized edema: Secondary | ICD-10-CM | POA: Diagnosis not present

## 2019-07-27 DIAGNOSIS — M79662 Pain in left lower leg: Secondary | ICD-10-CM | POA: Diagnosis not present

## 2019-07-27 DIAGNOSIS — E039 Hypothyroidism, unspecified: Secondary | ICD-10-CM | POA: Diagnosis not present

## 2019-09-08 DIAGNOSIS — R600 Localized edema: Secondary | ICD-10-CM | POA: Diagnosis not present

## 2019-09-08 DIAGNOSIS — M79662 Pain in left lower leg: Secondary | ICD-10-CM | POA: Diagnosis not present

## 2019-09-10 DIAGNOSIS — J019 Acute sinusitis, unspecified: Secondary | ICD-10-CM | POA: Diagnosis not present

## 2019-09-30 DIAGNOSIS — M9903 Segmental and somatic dysfunction of lumbar region: Secondary | ICD-10-CM | POA: Diagnosis not present

## 2019-10-18 ENCOUNTER — Other Ambulatory Visit: Payer: Self-pay | Admitting: *Deleted

## 2019-10-18 DIAGNOSIS — M7989 Other specified soft tissue disorders: Secondary | ICD-10-CM

## 2019-10-18 DIAGNOSIS — S00252A Superficial foreign body of left eyelid and periocular area, initial encounter: Secondary | ICD-10-CM | POA: Diagnosis not present

## 2019-10-20 ENCOUNTER — Telehealth (HOSPITAL_COMMUNITY): Payer: Self-pay

## 2019-10-20 NOTE — Telephone Encounter (Signed)

## 2019-10-21 ENCOUNTER — Ambulatory Visit (HOSPITAL_COMMUNITY)
Admission: RE | Admit: 2019-10-21 | Discharge: 2019-10-21 | Disposition: A | Discharge status: Home / Self Care | Payer: Medicare Other | Source: Ambulatory Visit | Attending: Vascular Surgery | Admitting: Vascular Surgery

## 2019-10-21 ENCOUNTER — Ambulatory Visit: Payer: Medicare Other | Admitting: Vascular Surgery

## 2019-10-21 ENCOUNTER — Ambulatory Visit (INDEPENDENT_AMBULATORY_CARE_PROVIDER_SITE_OTHER): Payer: Medicare Other | Admitting: Physician Assistant

## 2019-10-21 ENCOUNTER — Other Ambulatory Visit: Payer: Self-pay

## 2019-10-21 VITALS — BP 129/68 | HR 69 | Temp 97.8°F | Resp 16 | Ht 63.0 in | Wt 161.0 lb

## 2019-10-21 DIAGNOSIS — M7989 Other specified soft tissue disorders: Secondary | ICD-10-CM | POA: Insufficient documentation

## 2019-10-21 NOTE — Progress Notes (Signed)
VASCULAR & VEIN SPECIALISTS           OF [place]  History and Physical   Elizabeth Miranda is a 69 y.o. (1951/03/16) female who presents with hx of left leg swelling.  She states that she has had swelling in the left leg for several years with tenderness throughout the leg.  It is not changed by standing or sitting or walking.  However, in January, she developed worsening tenderness and redness of the left leg and thought she may have a DVT and we to the ER.    She states that she did not have a DVT and this eventually improved over the next several days.  She has been wearing thigh high compression stockings with some relief.  She states that when she wakes in the am, her swelling is improved.   There is not family hx of varicose veins.  She does not have any skin changes.   She denies hx of CHF, DVT, abdominal or groin procedures.  She has had three children.   She is a Chartered loss adjuster.   The pt is not on a statin for cholesterol management.  The pt is not on a daily aspirin.   Other AC:  none The pt is not on meds for hypertension.   The pt is not diabetic.   Tobacco hx:  never  Past Medical History:  Diagnosis Date  . Hypothyroid     Past Surgical History:  Procedure Laterality Date  . APPENDECTOMY    . complete hysterectomy'    . PARTIAL HYSTERECTOMY      Social History   Socioeconomic History  . Marital status: Married    Spouse name: Not on file  . Number of children: Not on file  . Years of education: Not on file  . Highest education level: Not on file  Occupational History  . Not on file  Tobacco Use  . Smoking status: Never Smoker  . Smokeless tobacco: Never Used  Substance and Sexual Activity  . Alcohol use: No  . Drug use: No  . Sexual activity: Not on file  Other Topics Concern  . Not on file  Social History Narrative  . Not on file   Social Determinants of Health   Financial Resource Strain:   . Difficulty of Paying Living  Expenses:   Food Insecurity:   . Worried About Charity fundraiser in the Last Year:   . Arboriculturist in the Last Year:   Transportation Needs:   . Film/video editor (Medical):   Marland Kitchen Lack of Transportation (Non-Medical):   Physical Activity:   . Days of Exercise per Week:   . Minutes of Exercise per Session:   Stress:   . Feeling of Stress :   Social Connections:   . Frequency of Communication with Friends and Family:   . Frequency of Social Gatherings with Friends and Family:   . Attends Religious Services:   . Active Member of Clubs or Organizations:   . Attends Archivist Meetings:   Marland Kitchen Marital Status:   Intimate Partner Violence:   . Fear of Current or Ex-Partner:   . Emotionally Abused:   Marland Kitchen Physically Abused:   . Sexually Abused:     Family Hx:  No family hx of varicose veins.   Current Outpatient Medications  Medication Sig Dispense Refill  . B Complex-C (B-COMPLEX WITH VITAMIN C) tablet Take 1 tablet  by mouth daily.    Marland Kitchen BROMELAINS PO Take 1 capsule by mouth daily.    Marland Kitchen ESTRADIOL PO Take 1.5 mg by mouth daily. Compound into a capsule.    . Estradiol-Estriol-Progesterone (BIEST/PROGESTERONE TD) Place onto the skin. 80/20 compound    . lactobacillus acidophilus (BACID) TABS tablet Take 2 tablets by mouth 3 (three) times daily.    Marland Kitchen levothyroxine (SYNTHROID, LEVOTHROID) 100 MCG tablet Take 1 tablet by mouth daily.  4  . liothyronine (CYTOMEL) 5 MCG tablet Take 1 tablet by mouth daily.  0  . Multiple Vitamin (MULITIVITAMIN WITH MINERALS) TABS Take 1 tablet by mouth daily.    . Omega-3 Fatty Acids (OMEGA-3 PLUS PO) Take 2 capsules by mouth daily.    . polyethylene glycol-electrolytes (NULYTELY/GOLYTELY) 420 G solution Take 4,000 mLs by mouth once. 4000 mL 0  . progesterone (PROMETRIUM) 200 MG capsule Take 200 mg by mouth daily.    . Testosterone 10 MG/ACT (2%) GEL Place onto the skin. Cream daily     No current facility-administered medications for this  visit.    Allergies  Allergen Reactions  . Codeine     Hives   . Sulfa Antibiotics Nausea And Vomiting    REVIEW OF SYSTEMS:   [X]  denotes positive finding, [ ]  denotes negative finding Cardiac  Comments:  Chest pain or chest pressure:    Shortness of breath upon exertion:    Short of breath when lying flat:    Irregular heart rhythm:        Vascular    Pain in calf, thigh, or hip brought on by ambulation: x   Pain in feet at night that wakes you up from your sleep:     Blood clot in your veins:    Leg swelling:  x       Pulmonary    Oxygen at home:    Productive cough:     Wheezing:         Neurologic    Sudden weakness in arms or legs:     Sudden numbness in arms or legs:     Sudden onset of difficulty speaking or slurred speech:    Temporary loss of vision in one eye:     Problems with dizziness:         Gastrointestinal    Blood in stool:     Vomited blood:         Genitourinary    Burning when urinating:     Blood in urine:        Psychiatric    Major depression:         Hematologic    Bleeding problems:    Problems with blood clotting too easily:        Skin    Rashes or ulcers:        Constitutional    Fever or chills:      PHYSICAL EXAMINATION:  Today's Vitals   10/21/19 1302  BP: 129/68  Pulse: 69  Resp: 16  Temp: 97.8 F (36.6 C)  TempSrc: Temporal  SpO2: 100%  Weight: 161 lb (73 kg)  Height: 5\' 3"  (1.6 m)   Body mass index is 28.52 kg/m.   General:  WDWN in NAD; vital signs documented above Gait: Not observed HENT: WNL, normocephalic Pulmonary: normal non-labored breathing , without Rales, rhonchi,  wheezing Cardiac: regular HR; without carotid bruits Abdomen: soft, NT, no masses Skin: without rashes Vascular Exam/Pulses:  Right Left  Radial 2+ (normal)  2+ (normal)  Ulnar 1+ (weak) 1+ (weak)  Femoral 2+ (normal) 2+ (normal)  DP 2+ (normal) 2+ (normal)  PT 1+ (weak) 1+ (weak)   Extremities: without ischemic  changes, without Gangrene , without cellulitis; without open wounds;  Left leg swelling; no skin color changes; there are spider veins present bilaterally Musculoskeletal: no muscle wasting or atrophy  Neurologic: A&O X 3;  No focal weakness or paresthesias are detected Psychiatric:  The pt has Normal affect.   Non-Invasive Vascular Imaging:   Venous duplex on 10/21/2019: +--------------+---------+------+-----------+------------+--------+  LEFT     Reflux NoRefluxReflux TimeDiameter cmsComments               Yes                   +--------------+---------+------+-----------+------------+--------+  CFV            yes  >1 second             +--------------+---------+------+-----------+------------+--------+  FV mid    no                         +--------------+---------+------+-----------+------------+--------+  Popliteal   no                         +--------------+---------+------+-----------+------------+--------+  GSV at [facility]  no               0.40        +--------------+---------+------+-----------+------------+--------+  GSV prox thighno               0.42        +--------------+---------+------+-----------+------------+--------+  GSV mid thigh no               0.39        +--------------+---------+------+-----------+------------+--------+  GSV dist thighno               0.28        +--------------+---------+------+-----------+------------+--------+  GSV at knee  no               0.28        +--------------+---------+------+-----------+------------+--------+  GSV prox calf no               0.33        +--------------+---------+------+-----------+------------+--------+  SSV Pop Fossa no                0.13        +--------------+---------+------+-----------+------------+--------+   Summary:  Left:  - No evidence of deep vein thrombosis seen in the left lower extremity,  from the common femoral through the popliteal veins.  - No evidence of superficial venous thrombosis in the left lower  extremity.  - No evidence of superficial venous reflux seen in the left greater  saphenous vein.  - No evidence of superficial venous reflux seen in the left short  saphenous vein.  - Venous reflux is noted in the left common femoral vein.   Elizabeth Miranda is a 69 y.o. female who presents with: left leg swelling with tenderness for several years  Pt u/s today revealed no DVT and only reflux in the CFV.  May Thurner Syndrome in the differential and iliocaval duplex obtained.  Bilateral iliac veins similar in diameter as well as velocities bilaterally and therefore, most likely not May Thurner.  There was some irregularity of the wall of the left iliac vein that could indicative that old DVT was present in the past,  but nothing present now.   -discussed findings with pt and she will continue her compression and elevation.  -she will f/u with [place] as needed.   Leontine Locket, [facility] 10/21/2019 12:34 PM  Clinic MD:  Oneida Alar

## 2020-01-03 DIAGNOSIS — M25552 Pain in left hip: Secondary | ICD-10-CM | POA: Diagnosis not present

## 2020-01-03 DIAGNOSIS — M25551 Pain in right hip: Secondary | ICD-10-CM | POA: Diagnosis not present

## 2020-01-07 DIAGNOSIS — M7060 Trochanteric bursitis, unspecified hip: Secondary | ICD-10-CM | POA: Diagnosis not present

## 2020-01-07 DIAGNOSIS — R262 Difficulty in walking, not elsewhere classified: Secondary | ICD-10-CM | POA: Diagnosis not present

## 2020-01-07 DIAGNOSIS — M25559 Pain in unspecified hip: Secondary | ICD-10-CM | POA: Diagnosis not present

## 2020-01-11 DIAGNOSIS — M7060 Trochanteric bursitis, unspecified hip: Secondary | ICD-10-CM | POA: Diagnosis not present

## 2020-01-11 DIAGNOSIS — R262 Difficulty in walking, not elsewhere classified: Secondary | ICD-10-CM | POA: Diagnosis not present

## 2020-01-11 DIAGNOSIS — M25559 Pain in unspecified hip: Secondary | ICD-10-CM | POA: Diagnosis not present

## 2020-01-14 DIAGNOSIS — M25559 Pain in unspecified hip: Secondary | ICD-10-CM | POA: Diagnosis not present

## 2020-01-14 DIAGNOSIS — M7060 Trochanteric bursitis, unspecified hip: Secondary | ICD-10-CM | POA: Diagnosis not present

## 2020-01-14 DIAGNOSIS — R262 Difficulty in walking, not elsewhere classified: Secondary | ICD-10-CM | POA: Diagnosis not present

## 2020-01-18 DIAGNOSIS — R262 Difficulty in walking, not elsewhere classified: Secondary | ICD-10-CM | POA: Diagnosis not present

## 2020-01-18 DIAGNOSIS — M7060 Trochanteric bursitis, unspecified hip: Secondary | ICD-10-CM | POA: Diagnosis not present

## 2020-01-18 DIAGNOSIS — M25559 Pain in unspecified hip: Secondary | ICD-10-CM | POA: Diagnosis not present

## 2020-01-20 DIAGNOSIS — M7060 Trochanteric bursitis, unspecified hip: Secondary | ICD-10-CM | POA: Diagnosis not present

## 2020-01-20 DIAGNOSIS — R262 Difficulty in walking, not elsewhere classified: Secondary | ICD-10-CM | POA: Diagnosis not present

## 2020-01-20 DIAGNOSIS — M25559 Pain in unspecified hip: Secondary | ICD-10-CM | POA: Diagnosis not present

## 2020-01-24 DIAGNOSIS — R262 Difficulty in walking, not elsewhere classified: Secondary | ICD-10-CM | POA: Diagnosis not present

## 2020-01-24 DIAGNOSIS — M7060 Trochanteric bursitis, unspecified hip: Secondary | ICD-10-CM | POA: Diagnosis not present

## 2020-01-24 DIAGNOSIS — M25559 Pain in unspecified hip: Secondary | ICD-10-CM | POA: Diagnosis not present

## 2020-01-27 DIAGNOSIS — R262 Difficulty in walking, not elsewhere classified: Secondary | ICD-10-CM | POA: Diagnosis not present

## 2020-01-27 DIAGNOSIS — M7060 Trochanteric bursitis, unspecified hip: Secondary | ICD-10-CM | POA: Diagnosis not present

## 2020-01-27 DIAGNOSIS — M25559 Pain in unspecified hip: Secondary | ICD-10-CM | POA: Diagnosis not present

## 2020-02-01 DIAGNOSIS — M7060 Trochanteric bursitis, unspecified hip: Secondary | ICD-10-CM | POA: Diagnosis not present

## 2020-02-01 DIAGNOSIS — R262 Difficulty in walking, not elsewhere classified: Secondary | ICD-10-CM | POA: Diagnosis not present

## 2020-02-01 DIAGNOSIS — M25559 Pain in unspecified hip: Secondary | ICD-10-CM | POA: Diagnosis not present

## 2020-02-04 DIAGNOSIS — M7060 Trochanteric bursitis, unspecified hip: Secondary | ICD-10-CM | POA: Diagnosis not present

## 2020-02-04 DIAGNOSIS — R262 Difficulty in walking, not elsewhere classified: Secondary | ICD-10-CM | POA: Diagnosis not present

## 2020-02-04 DIAGNOSIS — M25559 Pain in unspecified hip: Secondary | ICD-10-CM | POA: Diagnosis not present

## 2020-03-13 DIAGNOSIS — M25559 Pain in unspecified hip: Secondary | ICD-10-CM | POA: Diagnosis not present

## 2020-03-13 DIAGNOSIS — R262 Difficulty in walking, not elsewhere classified: Secondary | ICD-10-CM | POA: Diagnosis not present

## 2020-03-13 DIAGNOSIS — M7060 Trochanteric bursitis, unspecified hip: Secondary | ICD-10-CM | POA: Diagnosis not present

## 2020-03-16 DIAGNOSIS — S233XXA Sprain of ligaments of thoracic spine, initial encounter: Secondary | ICD-10-CM | POA: Diagnosis not present

## 2020-03-16 DIAGNOSIS — M9902 Segmental and somatic dysfunction of thoracic region: Secondary | ICD-10-CM | POA: Diagnosis not present

## 2020-03-16 DIAGNOSIS — M25559 Pain in unspecified hip: Secondary | ICD-10-CM | POA: Diagnosis not present

## 2020-03-16 DIAGNOSIS — M47812 Spondylosis without myelopathy or radiculopathy, cervical region: Secondary | ICD-10-CM | POA: Diagnosis not present

## 2020-03-16 DIAGNOSIS — M47816 Spondylosis without myelopathy or radiculopathy, lumbar region: Secondary | ICD-10-CM | POA: Diagnosis not present

## 2020-03-16 DIAGNOSIS — S338XXA Sprain of other parts of lumbar spine and pelvis, initial encounter: Secondary | ICD-10-CM | POA: Diagnosis not present

## 2020-03-16 DIAGNOSIS — M9903 Segmental and somatic dysfunction of lumbar region: Secondary | ICD-10-CM | POA: Diagnosis not present

## 2020-03-16 DIAGNOSIS — R262 Difficulty in walking, not elsewhere classified: Secondary | ICD-10-CM | POA: Diagnosis not present

## 2020-03-16 DIAGNOSIS — M7060 Trochanteric bursitis, unspecified hip: Secondary | ICD-10-CM | POA: Diagnosis not present

## 2020-03-16 DIAGNOSIS — M9901 Segmental and somatic dysfunction of cervical region: Secondary | ICD-10-CM | POA: Diagnosis not present

## 2020-03-20 DIAGNOSIS — S233XXA Sprain of ligaments of thoracic spine, initial encounter: Secondary | ICD-10-CM | POA: Diagnosis not present

## 2020-03-20 DIAGNOSIS — M9902 Segmental and somatic dysfunction of thoracic region: Secondary | ICD-10-CM | POA: Diagnosis not present

## 2020-03-20 DIAGNOSIS — S338XXA Sprain of other parts of lumbar spine and pelvis, initial encounter: Secondary | ICD-10-CM | POA: Diagnosis not present

## 2020-03-20 DIAGNOSIS — M47812 Spondylosis without myelopathy or radiculopathy, cervical region: Secondary | ICD-10-CM | POA: Diagnosis not present

## 2020-03-20 DIAGNOSIS — M7060 Trochanteric bursitis, unspecified hip: Secondary | ICD-10-CM | POA: Diagnosis not present

## 2020-03-20 DIAGNOSIS — M9903 Segmental and somatic dysfunction of lumbar region: Secondary | ICD-10-CM | POA: Diagnosis not present

## 2020-03-20 DIAGNOSIS — R262 Difficulty in walking, not elsewhere classified: Secondary | ICD-10-CM | POA: Diagnosis not present

## 2020-03-20 DIAGNOSIS — M9901 Segmental and somatic dysfunction of cervical region: Secondary | ICD-10-CM | POA: Diagnosis not present

## 2020-03-20 DIAGNOSIS — M25559 Pain in unspecified hip: Secondary | ICD-10-CM | POA: Diagnosis not present

## 2020-03-20 DIAGNOSIS — M47816 Spondylosis without myelopathy or radiculopathy, lumbar region: Secondary | ICD-10-CM | POA: Diagnosis not present

## 2020-07-17 ENCOUNTER — Other Ambulatory Visit (HOSPITAL_COMMUNITY): Payer: Self-pay | Admitting: Internal Medicine

## 2020-07-17 ENCOUNTER — Other Ambulatory Visit: Payer: Self-pay | Admitting: Internal Medicine

## 2020-07-17 DIAGNOSIS — K8020 Calculus of gallbladder without cholecystitis without obstruction: Secondary | ICD-10-CM

## 2020-07-18 ENCOUNTER — Ambulatory Visit (HOSPITAL_COMMUNITY)
Admission: RE | Admit: 2020-07-18 | Discharge: 2020-07-18 | Disposition: A | Discharge status: Home / Self Care | Payer: Medicare Other | Source: Ambulatory Visit | Attending: Internal Medicine | Admitting: Internal Medicine

## 2020-07-18 ENCOUNTER — Other Ambulatory Visit: Payer: Self-pay

## 2020-07-18 DIAGNOSIS — K8020 Calculus of gallbladder without cholecystitis without obstruction: Secondary | ICD-10-CM | POA: Insufficient documentation

## 2020-11-27 DIAGNOSIS — D2272 Melanocytic nevi of left lower limb, including hip: Secondary | ICD-10-CM | POA: Diagnosis not present

## 2020-11-27 DIAGNOSIS — D485 Neoplasm of uncertain behavior of skin: Secondary | ICD-10-CM | POA: Diagnosis not present

## 2020-11-27 DIAGNOSIS — L570 Actinic keratosis: Secondary | ICD-10-CM | POA: Diagnosis not present

## 2020-11-27 DIAGNOSIS — Z1283 Encounter for screening for malignant neoplasm of skin: Secondary | ICD-10-CM | POA: Diagnosis not present

## 2020-11-27 DIAGNOSIS — D2262 Melanocytic nevi of left upper limb, including shoulder: Secondary | ICD-10-CM | POA: Diagnosis not present

## 2020-11-27 DIAGNOSIS — D2261 Melanocytic nevi of right upper limb, including shoulder: Secondary | ICD-10-CM | POA: Diagnosis not present

## 2020-12-21 DIAGNOSIS — M25572 Pain in left ankle and joints of left foot: Secondary | ICD-10-CM | POA: Diagnosis not present

## 2020-12-25 ENCOUNTER — Other Ambulatory Visit: Payer: Self-pay | Admitting: Orthopedic Surgery

## 2020-12-25 DIAGNOSIS — M25572 Pain in left ankle and joints of left foot: Secondary | ICD-10-CM

## 2020-12-31 ENCOUNTER — Ambulatory Visit
Admission: RE | Admit: 2020-12-31 | Discharge: 2020-12-31 | Disposition: A | Discharge status: Home / Self Care | Payer: Medicare Other | Source: Ambulatory Visit | Attending: Orthopedic Surgery | Admitting: Orthopedic Surgery

## 2020-12-31 DIAGNOSIS — M25572 Pain in left ankle and joints of left foot: Secondary | ICD-10-CM

## 2020-12-31 DIAGNOSIS — S93492A Sprain of other ligament of left ankle, initial encounter: Secondary | ICD-10-CM | POA: Diagnosis not present

## 2021-01-09 DIAGNOSIS — M25572 Pain in left ankle and joints of left foot: Secondary | ICD-10-CM | POA: Diagnosis not present

## 2021-01-30 DIAGNOSIS — M25572 Pain in left ankle and joints of left foot: Secondary | ICD-10-CM | POA: Diagnosis not present

## 2021-01-31 DIAGNOSIS — M25572 Pain in left ankle and joints of left foot: Secondary | ICD-10-CM | POA: Diagnosis not present

## 2021-03-16 DIAGNOSIS — M25572 Pain in left ankle and joints of left foot: Secondary | ICD-10-CM | POA: Diagnosis not present

## 2021-03-16 DIAGNOSIS — M7662 Achilles tendinitis, left leg: Secondary | ICD-10-CM | POA: Diagnosis not present

## 2021-03-26 DIAGNOSIS — M6281 Muscle weakness (generalized): Secondary | ICD-10-CM | POA: Diagnosis not present

## 2021-03-26 DIAGNOSIS — M7662 Achilles tendinitis, left leg: Secondary | ICD-10-CM | POA: Diagnosis not present

## 2021-03-26 DIAGNOSIS — R262 Difficulty in walking, not elsewhere classified: Secondary | ICD-10-CM | POA: Diagnosis not present

## 2021-03-27 DIAGNOSIS — S93602S Unspecified sprain of left foot, sequela: Secondary | ICD-10-CM | POA: Diagnosis not present

## 2021-03-27 DIAGNOSIS — R1031 Right lower quadrant pain: Secondary | ICD-10-CM | POA: Diagnosis not present

## 2021-03-29 DIAGNOSIS — M6281 Muscle weakness (generalized): Secondary | ICD-10-CM | POA: Diagnosis not present

## 2021-03-29 DIAGNOSIS — M7662 Achilles tendinitis, left leg: Secondary | ICD-10-CM | POA: Diagnosis not present

## 2021-03-29 DIAGNOSIS — R262 Difficulty in walking, not elsewhere classified: Secondary | ICD-10-CM | POA: Diagnosis not present

## 2021-04-02 DIAGNOSIS — M7662 Achilles tendinitis, left leg: Secondary | ICD-10-CM | POA: Diagnosis not present

## 2021-04-02 DIAGNOSIS — R262 Difficulty in walking, not elsewhere classified: Secondary | ICD-10-CM | POA: Diagnosis not present

## 2021-04-02 DIAGNOSIS — M6281 Muscle weakness (generalized): Secondary | ICD-10-CM | POA: Diagnosis not present

## 2021-04-04 DIAGNOSIS — M7662 Achilles tendinitis, left leg: Secondary | ICD-10-CM | POA: Diagnosis not present

## 2021-04-04 DIAGNOSIS — R262 Difficulty in walking, not elsewhere classified: Secondary | ICD-10-CM | POA: Diagnosis not present

## 2021-04-04 DIAGNOSIS — M6281 Muscle weakness (generalized): Secondary | ICD-10-CM | POA: Diagnosis not present

## 2021-04-09 DIAGNOSIS — R262 Difficulty in walking, not elsewhere classified: Secondary | ICD-10-CM | POA: Diagnosis not present

## 2021-04-09 DIAGNOSIS — M6281 Muscle weakness (generalized): Secondary | ICD-10-CM | POA: Diagnosis not present

## 2021-04-09 DIAGNOSIS — M7662 Achilles tendinitis, left leg: Secondary | ICD-10-CM | POA: Diagnosis not present

## 2021-04-12 DIAGNOSIS — M6281 Muscle weakness (generalized): Secondary | ICD-10-CM | POA: Diagnosis not present

## 2021-04-12 DIAGNOSIS — R262 Difficulty in walking, not elsewhere classified: Secondary | ICD-10-CM | POA: Diagnosis not present

## 2021-04-12 DIAGNOSIS — M7662 Achilles tendinitis, left leg: Secondary | ICD-10-CM | POA: Diagnosis not present

## 2021-04-17 DIAGNOSIS — M7662 Achilles tendinitis, left leg: Secondary | ICD-10-CM | POA: Diagnosis not present

## 2021-04-17 DIAGNOSIS — M6281 Muscle weakness (generalized): Secondary | ICD-10-CM | POA: Diagnosis not present

## 2021-04-17 DIAGNOSIS — R262 Difficulty in walking, not elsewhere classified: Secondary | ICD-10-CM | POA: Diagnosis not present

## 2021-04-20 DIAGNOSIS — M6281 Muscle weakness (generalized): Secondary | ICD-10-CM | POA: Diagnosis not present

## 2021-04-20 DIAGNOSIS — M7662 Achilles tendinitis, left leg: Secondary | ICD-10-CM | POA: Diagnosis not present

## 2021-04-20 DIAGNOSIS — R262 Difficulty in walking, not elsewhere classified: Secondary | ICD-10-CM | POA: Diagnosis not present

## 2021-05-04 DIAGNOSIS — M25572 Pain in left ankle and joints of left foot: Secondary | ICD-10-CM | POA: Diagnosis not present

## 2021-10-09 DIAGNOSIS — E782 Mixed hyperlipidemia: Secondary | ICD-10-CM | POA: Diagnosis not present

## 2021-10-09 DIAGNOSIS — E611 Iron deficiency: Secondary | ICD-10-CM | POA: Diagnosis not present

## 2021-10-09 DIAGNOSIS — E063 Autoimmune thyroiditis: Secondary | ICD-10-CM | POA: Diagnosis not present

## 2021-10-09 DIAGNOSIS — E039 Hypothyroidism, unspecified: Secondary | ICD-10-CM | POA: Diagnosis not present

## 2021-10-09 DIAGNOSIS — R7309 Other abnormal glucose: Secondary | ICD-10-CM | POA: Diagnosis not present

## 2021-10-09 DIAGNOSIS — E559 Vitamin D deficiency, unspecified: Secondary | ICD-10-CM | POA: Diagnosis not present

## 2022-01-20 IMAGING — MR MR ANKLE*L* W/O CM
4 of 6 series · 19 of 40 positions shown · non-contrast
Comparison: None.

CLINICAL DATA: Left ankle pain since a fall a few years ago.

EXAM:
MRI OF THE LEFT ANKLE WITHOUT CONTRAST
TECHNIQUE: Multiplanar, multisequence MR imaging of the ankle was performed. No
intravenous contrast was administered.

[Series 3: PD fat-sat · axial · left · 3.0mm · 0.31mm/px · z∈[-32,+84]mm · 8 of 30 slices shown]
[im 1/30]
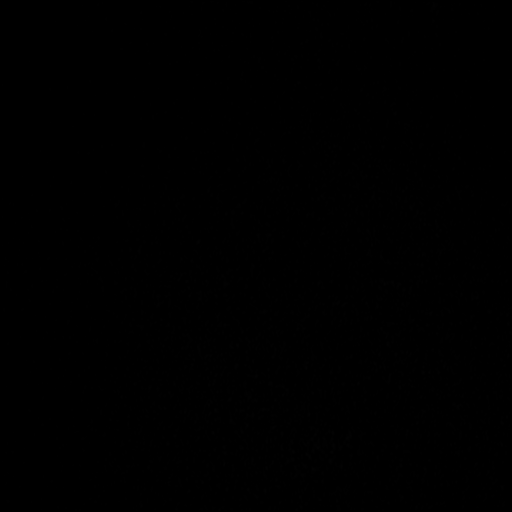
[im 5/30]
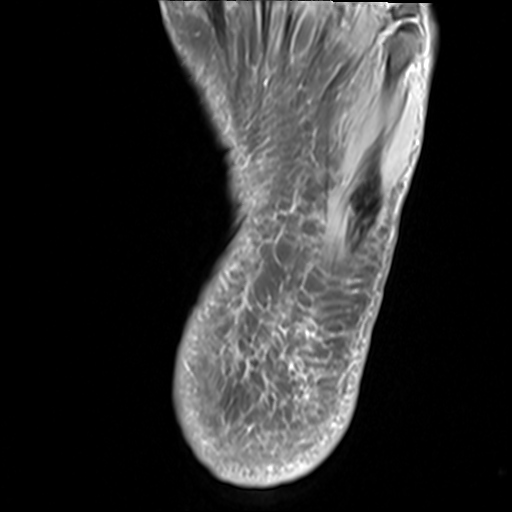
[im 9/30]
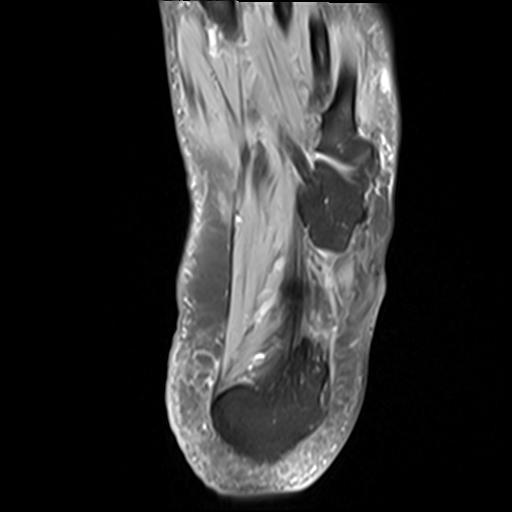
[im 13/30]
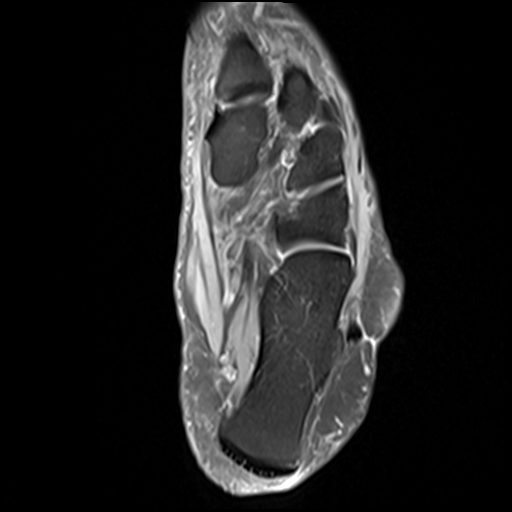
[im 17/30]
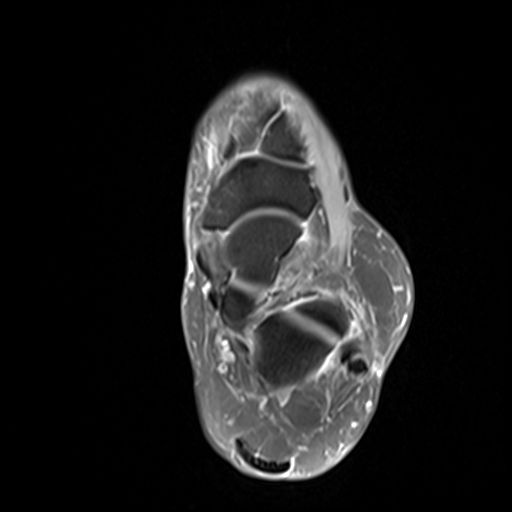
[im 21/30]
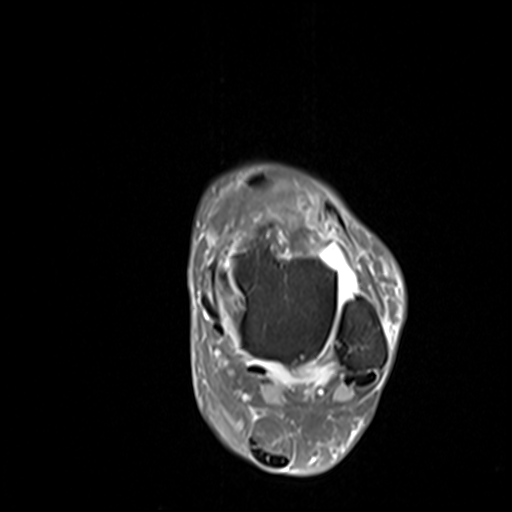
[im 25/30]
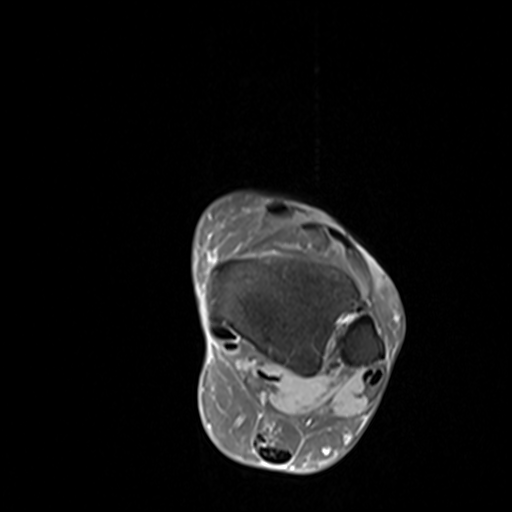
[im 30/30]
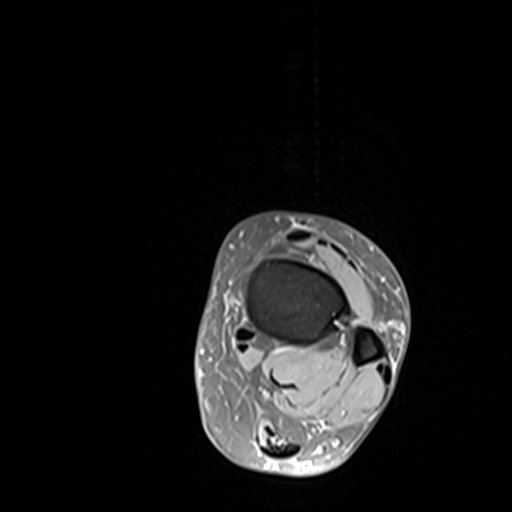

[Series 4: T2 fat-sat · axial · left · 3.0mm · 0.31mm/px · z∈[-32,+64]mm · 5 of 30 slices shown (1 of 2)]
[im 1/30]
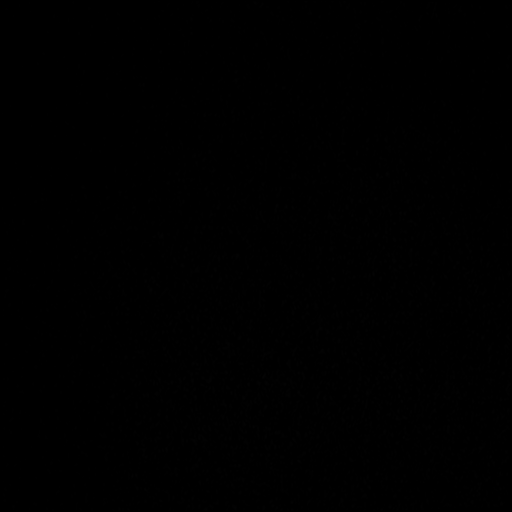
[im 5/30]
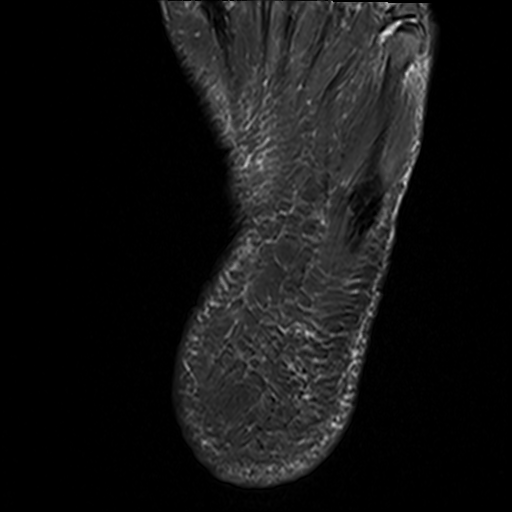
[im 10/30]
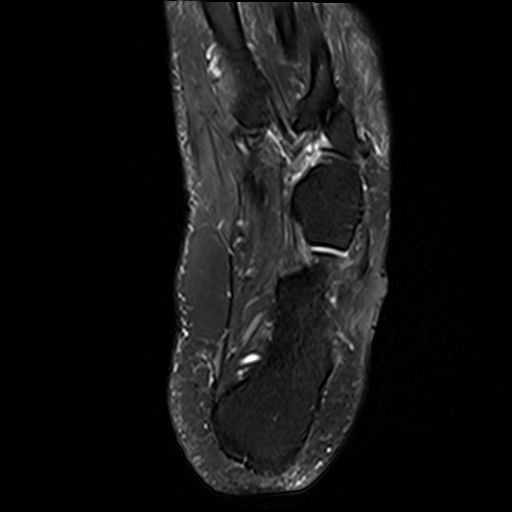
[im 15/30]
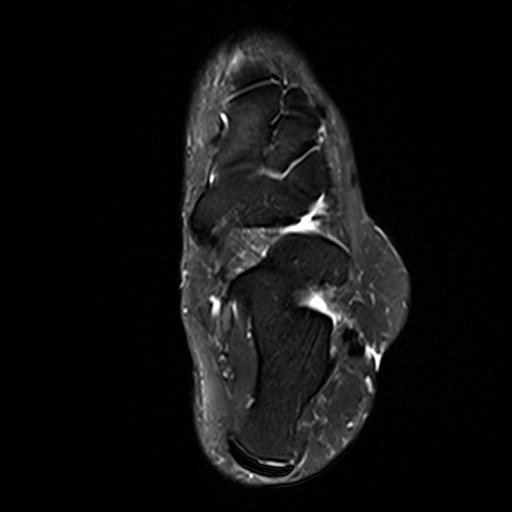
[im 25/30]
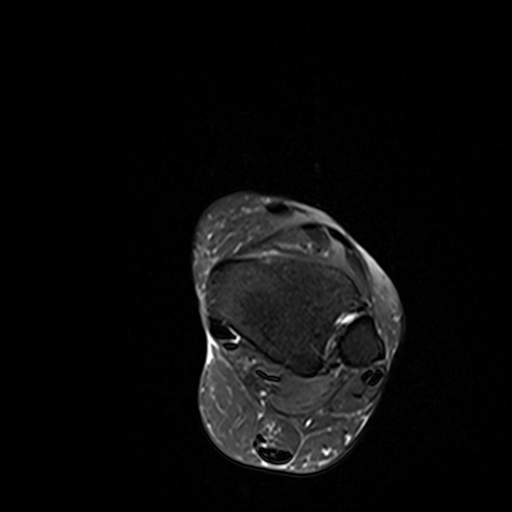

[Series 5: T1 · axial · left · 3.0mm · 0.31mm/px · z∈[-13,+69]mm · 3 of 33 slices shown]
[im 6/33]
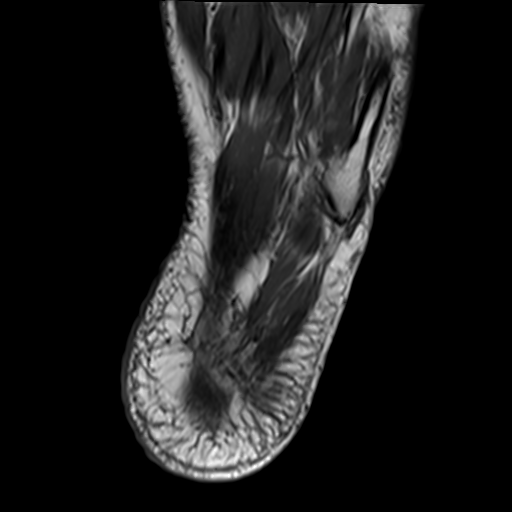
[im 17/33]
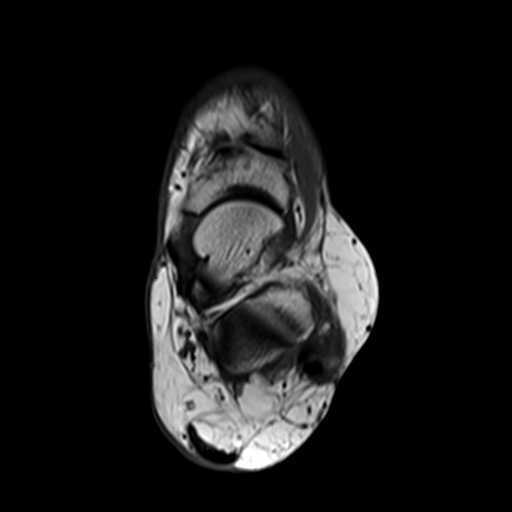
[im 27/33]
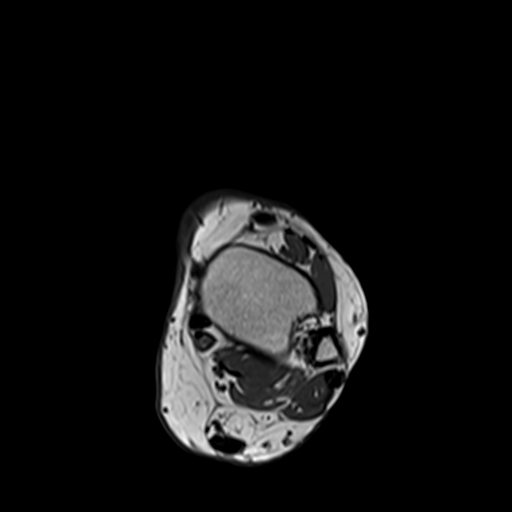

[Series 8: T2 fat-sat · coronal · left · 3.0mm · 0.31mm/px · 3 of 36 slices shown (2 of 2)]
[im 6/36]
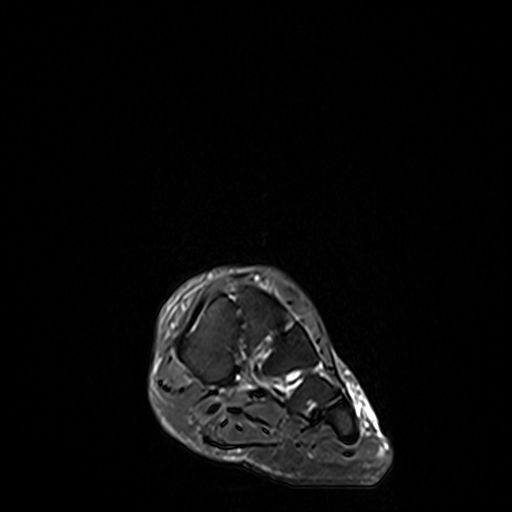
[im 21/36]
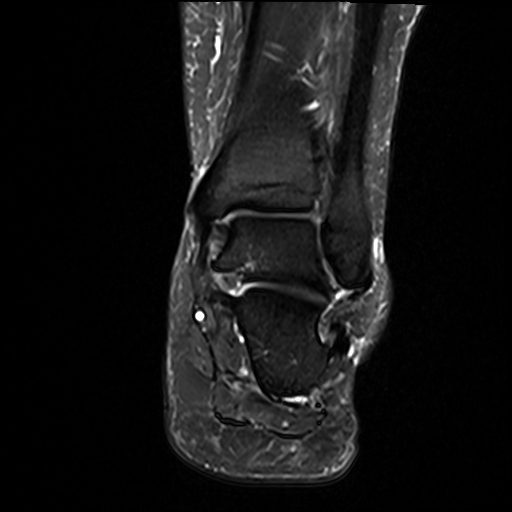
[im 31/36]
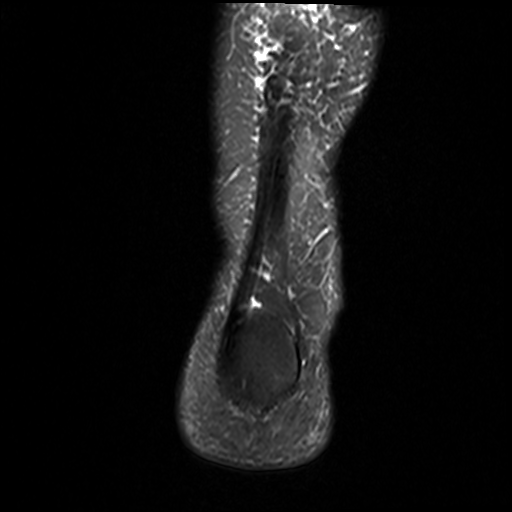

[19 of 40 positions shown; findings below may reference images not displayed]

FINDINGS: TENDONS

Peroneal: Intact.

Posteromedial: Intact.

Anterior: Intact.

Achilles: Intact.

Plantar Fascia: Intact.  Negative for plantar fasciitis.

LIGAMENTS

Lateral: The anterior talofibular ligament is markedly attenuated
consistent with near complete tear. Lateral ligaments are otherwise
intact.

Medial: Intact.

CARTILAGE

Ankle Joint: Negative. No osteochondral lesion of the talar dome or
joint effusion.

Subtalar Joints/Sinus Tarsi: Normal.

Bones: Negative. No fracture, stress change, contusion or focal
lesion. No osteophytosis.

Other: None.
IMPRESSION: High-grade, near complete ATFL tear appears chronic. The exam is
otherwise negative.

## 2022-05-14 DIAGNOSIS — R002 Palpitations: Secondary | ICD-10-CM | POA: Diagnosis not present

## 2022-05-27 ENCOUNTER — Ambulatory Visit: Payer: Medicare Other | Attending: Cardiovascular Disease | Admitting: Cardiovascular Disease

## 2022-05-27 ENCOUNTER — Other Ambulatory Visit: Payer: Self-pay | Admitting: Cardiovascular Disease

## 2022-05-27 ENCOUNTER — Ambulatory Visit (INDEPENDENT_AMBULATORY_CARE_PROVIDER_SITE_OTHER): Payer: Medicare Other

## 2022-05-27 VITALS — BP 130/85 | HR 77 | Ht 60.5 in | Wt 151.2 lb

## 2022-05-27 DIAGNOSIS — R079 Chest pain, unspecified: Secondary | ICD-10-CM

## 2022-05-27 DIAGNOSIS — R0609 Other forms of dyspnea: Secondary | ICD-10-CM

## 2022-05-27 DIAGNOSIS — R6889 Other general symptoms and signs: Secondary | ICD-10-CM

## 2022-05-27 DIAGNOSIS — R002 Palpitations: Secondary | ICD-10-CM | POA: Diagnosis not present

## 2022-05-27 NOTE — Patient Instructions (Addendum)
Medication Instructions:  Your physician recommends that you continue on your current medications as directed. Please refer to the Current Medication list given to you today.  *If you need a refill on your cardiac medications before your next appointment, please call your pharmacy*   Lab Work: Lipids, CMET, CBC, TSH, Troponin, D-dimer Today If you have labs (blood work) drawn today and your tests are completely normal, you will receive your results only by: [place] (if you have MyChart) OR A paper copy in the mail If you have any lab test that is abnormal or we need to change your treatment, we will call you to review the results.   Testing/Procedures: ECHO Your physician has requested that you have an echocardiogram. Echocardiography is a painless test that uses sound waves to create images of your heart. It provides your doctor with information about the size and shape of your heart and how well your heart's chambers and valves are working. This procedure takes approximately one hour. There are no restrictions for this procedure. Please do NOT wear cologne, perfume, aftershave, or lotions (deodorant is allowed). Please arrive 15 minutes prior to your appointment time.  14-day Zio monitor Your physician has recommended that you wear an event monitor. Event monitors are medical devices that record the heart's electrical activity. Doctors most often [place] these monitors to diagnose arrhythmias. Arrhythmias are problems with the speed or rhythm of the heartbeat. The monitor is a small, portable device. You can wear one while you do your normal daily activities. This is usually used to diagnose what is causing palpitations/syncope (passing out).   Follow-Up: At [facility], you and your health needs are our priority.  As part of our continuing mission to provide you with exceptional heart care, we have created designated Provider Care Teams.  These Care Teams include your primary  Cardiologist (physician) and Advanced Practice Providers (APPs -  Physician Assistants and Nurse Practitioners) who all work together to provide you with the care you need, when you need it.  We recommend signing up for the patient portal called "MyChart".  Sign up information is provided on this After Visit Summary.  MyChart is used to connect with patients for Virtual Visits (Telemedicine).  Patients are able to view lab/test results, encounter notes, upcoming appointments, etc.  Non-urgent messages can be sent to your provider as well.   To learn more about what you can do with MyChart, go to [email or web address].    Your next appointment:   06/28/22 @ 10:30  The format for your next appointment:   In Person  Provider:   Mertie Moores, MD    Important Information About Sugar

## 2022-05-27 NOTE — Progress Notes (Unsigned)
ZIO XT [id number] from office inventory applied to patient.

## 2022-05-27 NOTE — Progress Notes (Signed)
Cardiology Office Note:    Date:  05/27/2022   ID:  Elizabeth Miranda, DOB 1950-08-17, MRN [id number]  PCP:  Celene Squibb, MD   [facility] Cardiologist:  Kaily Wragg     Referring MD: Celene Squibb, MD   Chief Complaint  Patient presents with   Palpitations   Shortness of Breath     History of Present Illness:    Elizabeth Miranda is a 71 y.o. female with a hx of palpitations,  DOE, exercise intolerance,  hypothyroidism.    Seen with daughter, Elizabeth Miranda.  In Sept. Noticed she would have palpitations at night when she was lying flat.   Thought the bed was vibrating.    Started having night sweats   Episodes typically at night when she is in bed.  Not during the day when she is busy .  Wakes her up out of sleep,   Does not snore,  No OSA   Had chest pain ,  does not feel like a heart attach according to her  No blood in stool.  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 Organic foods   No COVID vaccines    Family hx :   her fathers sister had heart issues .        Past Medical History:  Diagnosis Date   Hypothyroid    Palpitations     Past Surgical History:  Procedure Laterality Date   APPENDECTOMY     complete hysterectomy'     PARTIAL HYSTERECTOMY      Current Medications: Current Meds  Medication Sig   B Complex-C (B-COMPLEX WITH VITAMIN C) tablet Take 1 tablet by mouth daily.   cholecalciferol (VITAMIN D3) 25 MCG (1000 UNIT) tablet  Take 5,000 Units by mouth daily.   ESTRADIOL PO Take 1.5 mg by mouth daily. Compound into a capsule. Per patient taking 0.5 mg   Estradiol-Estriol-Progesterone (BIEST/PROGESTERONE TD) Place onto the skin. 80/20 compound. Per patient taking 1.5 mg daily   levothyroxine (SYNTHROID, LEVOTHROID) 100 MCG tablet Take 1 tablet by mouth daily.   Melatonin 5 MG/15ML LIQD Take by mouth 3 (three) times a week.   metFORMIN (GLUCOPHAGE-XR) 500 MG 24 hr tablet Take 1 tablet by mouth daily. Per patient taking for PCOS   Multiple Vitamin (MULITIVITAMIN WITH MINERALS) TABS Take 1 tablet by mouth daily.   Omega-3 Fatty Acids (OMEGA-3 PLUS PO) Take 2 capsules by mouth daily.   [DISCONTINUED] BROMELAINS PO Take 1 capsule by mouth daily.   [DISCONTINUED] lactobacillus acidophilus (BACID) TABS tablet Take 2 tablets by mouth 3 (three) times daily.   [DISCONTINUED] liothyronine (CYTOMEL) 5 MCG tablet Take 1 tablet by mouth daily.   [DISCONTINUED] polyethylene glycol-electrolytes (NULYTELY/GOLYTELY) 420 G solution Take 4,000 mLs by mouth once.   [DISCONTINUED] progesterone (PROMETRIUM) 200 MG capsule Take 200 mg by mouth  daily.   [DISCONTINUED] Testosterone 10 MG/ACT (2%) GEL Place onto the skin. Cream daily     Allergies:   Codeine and Sulfa antibiotics   Social History   Socioeconomic History   Marital status: Married    Spouse name: Not on file   Number of children: Not on file   Years of education: Not on file   Highest education level: Not on file  Occupational History   Not on file  Tobacco Use   Smoking status: Never   Smokeless tobacco: Never  Substance and Sexual Activity   Alcohol use: No   Drug use: No   Sexual activity: Not on file  Other Topics Concern   Not on file  Social History Narrative   Not on file   Social Determinants of Health   Financial Resource Strain: Not on file  Food Insecurity: Not on file  Transportation Needs: Not on file  Physical Activity: Not on file  Stress:  Not on file  Social Connections: Not on file     Family History: The patient's family history is not on file.  ROS:   Please see the history of present illness.     All other systems reviewed and are negative.  EKGs/Labs/Other Studies Reviewed:    The following studies were reviewed today:   EKG: May 27, 2022: Normal sinus rhythm at 77.  Possible left atrial enlargement.  Poor R wave progression-?  Previous septal infarct versus lead placement.  Recent Labs: No results found for requested labs within last 365 days.  Recent Lipid Panel No results found for: "CHOL", "TRIG", "HDL", "CHOLHDL", "VLDL", "LDLCALC", "LDLDIRECT"   Risk Assessment/Calculations:                Physical Exam:    VS:  BP 130/85   Pulse 77   Ht 5' 0.5" (1.537 m)   Wt 151 lb 3.2 oz (68.6 kg)   SpO2 98%   BMI 29.04 kg/m     Wt Readings from Last 3 Encounters:  05/27/22 151 lb 3.2 oz (68.6 kg)  10/21/19 161 lb (73 kg)  07/14/19 152 lb (68.9 kg)     GEN:  Well nourished, well developed in no acute distress HEENT: Normal NECK: No JVD; No carotid bruits LYMPHATICS: No lymphadenopathy CARDIAC: RRR, soft systolic murmur  RESPIRATORY:  Clear to auscultation without rales, wheezing or rhonchi  ABDOMEN: Soft, non-tender, non-distended MUSCULOSKELETAL:  No edema; No deformity  SKIN: Warm and dry NEUROLOGIC:  Alert and oriented x 3 PSYCHIATRIC:  Normal affect   ASSESSMENT:    1. DOE (dyspnea on exertion)   2. Activity intolerance   3. Chest pain of uncertain etiology    PLAN:    In order of problems listed above:  Shortness of breath with exertion: Elizabeth Miranda presents with profound shortness of breath with any sort of exertion that started several months ago.  At the same time she started having nighttime palpitations.  She has not had any serious episodes of chest pain until last night.  The pain lasted for several hours.  EKG shows normal sinus rhythm.  She has poor R wave progression  which is likely due to lead placement.  Would like to get an echocardiogram for further assessment of her LV function.  Will get labs including lipid profile, complete metabolic profile, CBC, TSH, troponin level.  Symptoms could be chronic pulmonary emboli . Will get a D-dimer. Echo should help with this eval   I would like to  get a 14-day event monitor.  I will see her back in the office in several weeks for follow-up visit.          Medication Adjustments/Labs and Tests Ordered: Current medicines are reviewed at length with the patient today.  Concerns regarding medicines are outlined above.  Orders Placed This Encounter  Procedures   Lipid panel   Comprehensive metabolic panel   CBC   TSH   Troponin T   D-dimer, quantitative   EKG 12-Lead   ECHOCARDIOGRAM COMPLETE   No orders of the defined types were placed in this encounter.    Patient Instructions  Medication Instructions:  Your physician recommends that you continue on your current medications as directed. Please refer to the Current Medication list given to you today.  *If you need a refill on your cardiac medications before your next appointment, please call your pharmacy*   Lab Work: Lipids, CMET, CBC, TSH, Troponin, D-dimer Today If you have labs (blood work) drawn today and your tests are completely normal, you will receive your results only by: [place] (if you have MyChart) OR A paper copy in the mail If you have any lab test that is abnormal or we need to change your treatment, we will call you to review the results.   Testing/Procedures: ECHO Your physician has requested that you have an echocardiogram. Echocardiography is a painless test that uses sound waves to create images of your heart. It provides your doctor with information about the size and shape of your heart and how well your heart's chambers and valves are working. This procedure takes approximately one hour. There are no  restrictions for this procedure. Please do NOT wear cologne, perfume, aftershave, or lotions (deodorant is allowed). Please arrive 15 minutes prior to your appointment time.  14-day Zio monitor Your physician has recommended that you wear an event monitor. Event monitors are medical devices that record the heart's electrical activity. Doctors most often [place] these monitors to diagnose arrhythmias. Arrhythmias are problems with the speed or rhythm of the heartbeat. The monitor is a small, portable device. You can wear one while you do your normal daily activities. This is usually used to diagnose what is causing palpitations/syncope (passing out).   Follow-Up: At [facility], you and your health needs are our priority.  As part of our continuing mission to provide you with exceptional heart care, we have created designated Provider Care Teams.  These Care Teams include your primary Cardiologist (physician) and Advanced Practice Providers (APPs -  Physician Assistants and Nurse Practitioners) who all work together to provide you with the care you need, when you need it.  We recommend signing up for the patient portal called "MyChart".  Sign up information is provided on this After Visit Summary.  MyChart is used to connect with patients for Virtual Visits (Telemedicine).  Patients are able to view lab/test results, encounter notes, upcoming appointments, etc.  Non-urgent messages can be sent to your provider as well.   To learn more about what you can do with MyChart, go to [email or web address].    Your next appointment:   06/28/22 @ 10:30  The format for your next appointment:   In Person  Provider:   Mertie Moores, MD    Important Information About Sugar         Signed, Mertie Moores, MD  05/27/2022 4:34 PM    [place]

## 2022-05-28 ENCOUNTER — Ambulatory Visit (HOSPITAL_COMMUNITY): Payer: Medicare Other | Attending: Cardiovascular Disease

## 2022-05-28 DIAGNOSIS — R778 Other specified abnormalities of plasma proteins: Secondary | ICD-10-CM | POA: Insufficient documentation

## 2022-05-28 DIAGNOSIS — R7989 Other specified abnormal findings of blood chemistry: Secondary | ICD-10-CM | POA: Insufficient documentation

## 2022-05-28 DIAGNOSIS — R0609 Other forms of dyspnea: Secondary | ICD-10-CM | POA: Diagnosis not present

## 2022-05-28 DIAGNOSIS — R002 Palpitations: Secondary | ICD-10-CM | POA: Insufficient documentation

## 2022-05-28 DIAGNOSIS — R6889 Other general symptoms and signs: Secondary | ICD-10-CM

## 2022-05-28 DIAGNOSIS — R079 Chest pain, unspecified: Secondary | ICD-10-CM | POA: Diagnosis not present

## 2022-05-28 LAB — COMPREHENSIVE METABOLIC PANEL
ALT: 21 IU/L (ref 0–32)
AST: 27 IU/L (ref 0–40)
Albumin/Globulin Ratio: 1.6 (ref 1.2–2.2)
Albumin: 4.8 g/dL (ref 3.8–4.8)
Alkaline Phosphatase: 70 IU/L (ref 44–121)
BUN/Creatinine Ratio: 17 (ref 12–28)
BUN: 13 mg/dL (ref 8–27)
Bilirubin Total: 0.2 mg/dL (ref 0.0–1.2)
CO2: 23 mmol/L (ref 20–29)
Calcium: 10 mg/dL (ref 8.7–10.3)
Chloride: 101 mmol/L (ref 96–106)
Creatinine, Ser: 0.75 mg/dL (ref 0.57–1.00)
Globulin, Total: 3 g/dL (ref 1.5–4.5)
Glucose: 68 mg/dL — ABNORMAL LOW (ref 70–99)
Potassium: 4.1 mmol/L (ref 3.5–5.2)
Sodium: 140 mmol/L (ref 134–144)
Total Protein: 7.8 g/dL (ref 6.0–8.5)
eGFR: 85 mL/min/{1.73_m2} (ref >59)

## 2022-05-28 LAB — LIPID PANEL
Chol/HDL Ratio: 4.1 ratio (ref 0.0–4.4)
Cholesterol, Total: 214 mg/dL — ABNORMAL HIGH (ref 100–199)
HDL: 52 mg/dL (ref >39)
LDL Chol Calc (NIH): 129 mg/dL — ABNORMAL HIGH (ref 0–99)
Triglycerides: 186 mg/dL — ABNORMAL HIGH (ref 0–149)
VLDL Cholesterol Cal: 33 mg/dL (ref 5–40)

## 2022-05-28 LAB — CBC
Hematocrit: 42.9 % (ref 34.0–46.6)
Hemoglobin: 14.2 g/dL (ref 11.1–15.9)
MCH: 29.2 pg (ref 26.6–33.0)
MCHC: 33.1 g/dL (ref 31.5–35.7)
MCV: 88 fL (ref 79–97)
Platelets: 380 10*3/uL (ref 150–450)
RBC: 4.87 x10E6/uL (ref 3.77–5.28)
RDW: 12.6 % (ref 11.7–15.4)
WBC: 6.8 10*3/uL (ref 3.4–10.8)

## 2022-05-28 LAB — ECHOCARDIOGRAM COMPLETE
Area-P 1/2: 3.17 cm2
S' Lateral: 2.5 cm

## 2022-05-28 LAB — TROPONIN T: Troponin T (Highly Sensitive): 9 ng/L (ref 0–14)

## 2022-05-28 LAB — TSH: TSH: 1.6 u[IU]/mL (ref 0.450–4.500)

## 2022-05-30 ENCOUNTER — Ambulatory Visit: Payer: Medicare Other | Admitting: Internal Medicine

## 2022-06-15 DIAGNOSIS — R002 Palpitations: Secondary | ICD-10-CM | POA: Diagnosis not present

## 2022-06-15 DIAGNOSIS — R0609 Other forms of dyspnea: Secondary | ICD-10-CM | POA: Diagnosis not present

## 2022-06-24 ENCOUNTER — Other Ambulatory Visit (HOSPITAL_COMMUNITY): Payer: Medicare Other

## 2022-06-28 ENCOUNTER — Encounter: Payer: Self-pay | Admitting: Cardiovascular Disease

## 2022-06-28 ENCOUNTER — Ambulatory Visit: Payer: Medicare Other | Attending: Cardiovascular Disease | Admitting: Cardiovascular Disease

## 2022-06-28 VITALS — BP 132/88 | HR 68 | Ht 62.0 in | Wt 152.2 lb

## 2022-06-28 DIAGNOSIS — R0609 Other forms of dyspnea: Secondary | ICD-10-CM

## 2022-06-28 NOTE — Patient Instructions (Addendum)
Medication Instructions:  Your physician recommends that you continue on your current medications as directed. Please refer to the Current Medication list given to you today.  *If you need a refill on your cardiac medications before your next appointment, please call your pharmacy*   Lab Work: NONE If you have labs (blood work) drawn today and your tests are completely normal, you will receive your results only by: [place] (if you have MyChart) OR A paper copy in the mail If you have any lab test that is abnormal or we need to change your treatment, we will call you to review the results.   Testing/Procedures: NONE   Follow-Up: At [facility], you and your health needs are our priority.  As part of our continuing mission to provide you with exceptional heart care, we have created designated Provider Care Teams.  These Care Teams include your primary Cardiologist (physician) and Advanced Practice Providers (APPs -  Physician Assistants and Nurse Practitioners) who all work together to provide you with the care you need, when you need it.  We recommend signing up for the patient portal called "MyChart".  Sign up information is provided on this After Visit Summary.  MyChart is used to connect with patients for Virtual Visits (Telemedicine).  Patients are able to view lab/test results, encounter notes, upcoming appointments, etc.  Non-urgent messages can be sent to your provider as well.   To learn more about what you can do with MyChart, go to [email or web address].    Your next appointment:   8 week(s)  Provider:   Mertie Moores, MD

## 2022-06-28 NOTE — Progress Notes (Unsigned)
Cardiology Office Note:    Date:  06/29/2022   ID:  Elizabeth Miranda, DOB [birth year], MRN [id number]  PCP:  Celene Squibb, MD   [place] [facility] Cardiologist:  Thea Holshouser     Referring MD: Celene Squibb, MD   Chief Complaint  Patient presents with   Shortness of Breath     History of Present Illness:    Elizabeth Miranda is a 72 y.o. female with a hx of palpitations,  DOE, exercise intolerance,  hypothyroidism.    Seen with daughter, [name].  In Sept. Noticed she would have palpitations at night when she was lying flat.   Thought the bed was vibrating.    Started having night sweats   Episodes typically at night when she is in bed.  Not during the day when she is busy .  Wakes her up out of sleep,   Does not snore,  No OSA   Had chest pain ,  does not feel like a heart attach according to her  No blood in stool.     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 Organic foods   No COVID vaccines    Family hx :   her fathers sister had heart issues .    Jan. 12, 2024  Elizabeth Miranda is seen for follow up of her severe DOE Echo  Dec. 12, 2023 revealed normal LV systolic function,  grade I DD, trivial MR  No structural cardiac issues to explain her dyspnea   Event monitor showed sinus rhythm, rare episodes of atrial tachycardia   Her DOE seems to be improving  Continues to have tachycardia  Has an [place] ring   Has tachycardia - she wakes up with fast HR on occasion .  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  Did a bioenergetic scan  ( hold electrodes, the software reads the bodies imbalances )   Now is having similar neuropathic pain .   Has RMSP this past summer - received 10 days of Doxycycline  Now her evidence of Erlichia .    Now on doxycycline for 21  and medrol dose pack ( 6 day) .   We discuss seeing how she does over the next several months after her Medrol Dosepak and after her 3-week doxycycline course.  If she still has issues with shortness of breath with exertion which might be an anginal equivalent I would have a very low threshold to pursue stress testing or coronary CT angiogram.      Past Medical History:  Diagnosis Date   Hypothyroid    Palpitations     Past Surgical History:  Procedure Laterality Date   APPENDECTOMY     complete hysterectomy'     PARTIAL HYSTERECTOMY      Current Medications: Current Meds  Medication Sig   B Complex-C (B-COMPLEX WITH VITAMIN C) tablet Take 1 tablet by mouth daily.   cholecalciferol (VITAMIN D3) 25 MCG (1000 UNIT) tablet Take 5,000 Units by mouth daily.   doxycycline (VIBRA-TABS) 100 MG tablet Take 100 mg by mouth 2 (two) times daily.   ESTRADIOL PO Take 1.5 mg by mouth daily. Compound into a capsule. Per patient taking 0.5 mg  Estradiol-Estriol-Progesterone (BIEST/PROGESTERONE TD) Place onto the skin. 80/20 compound. Per patient taking 1.5 mg daily   levothyroxine (SYNTHROID, LEVOTHROID) 100 MCG tablet Take 1 tablet by mouth daily.   Melatonin 5 MG/15ML LIQD Take by mouth 3 (three) times a week.   Multiple Vitamin (MULITIVITAMIN WITH MINERALS) TABS Take 1 tablet by mouth daily.   nitrofurantoin, macrocrystal-monohydrate, (MACROBID) 100 MG capsule Take 100 mg by mouth every 12 (twelve) hours.   Omega-3 Fatty Acids (OMEGA-3 PLUS PO) Take 2 capsules by mouth daily.     Allergies:   Codeine and Sulfa antibiotics   Social History   Socioeconomic History   Marital status: Married    Spouse name: Not on file   Number of children: Not on file   Years of education: Not on file   Highest education level: Not on file  Occupational History   Not on file  Tobacco Use   Smoking status: Never   Smokeless tobacco: Never  Substance and Sexual Activity    Alcohol use: No   Drug use: No   Sexual activity: Not on file  Other Topics Concern   Not on file  Social History Narrative   Not on file   Social Determinants of Health   Financial Resource Strain: Not on file  Food Insecurity: Not on file  Transportation Needs: Not on file  Physical Activity: Not on file  Stress: Not on file  Social Connections: Not on file     Family History: The patient's family history is not on file.  ROS:   Please see the history of present illness.     All other systems reviewed and are negative.  EKGs/Labs/Other Studies Reviewed:    The following studies were reviewed today:   EKG:    Recent Labs: 05/27/2022: ALT 21; BUN 13; Creatinine, Ser 0.75; Hemoglobin 14.2; Platelets 380; Potassium 4.1; Sodium 140; TSH 1.600  Recent Lipid Panel    Component Value Date/Time   CHOL 214 (H) 05/27/2022 1611   TRIG 186 (H) 05/27/2022 1611   HDL 52 05/27/2022 1611   CHOLHDL 4.1 05/27/2022 1611   LDLCALC 129 (H) 05/27/2022 1611     Risk Assessment/Calculations:                Physical Exam:     Physical Exam: Blood pressure 132/88, pulse 68, height '5\' 2"'$  (1.575 m), weight 152 lb 3.2 oz (69 kg), SpO2 99 %.       GEN:  Well nourished, well developed in no acute distress HEENT: Normal NECK: No JVD; No carotid bruits LYMPHATICS: No lymphadenopathy CARDIAC: RRR , no murmurs, rubs, gallops RESPIRATORY:  Clear to auscultation without rales, wheezing or rhonchi  ABDOMEN: Soft, non-tender, non-distended MUSCULOSKELETAL:  No edema; No deformity  SKIN: Warm and dry NEUROLOGIC:  Alert and oriented x 3   ASSESSMENT:    1. DOE (dyspnea on exertion)     PLAN:    In order of problems listed above:  Shortness of breath with exertion: [place] presents with profound shortness of breath with any sort of exertion that started several months ago.    She will see if she improves after treatment of her tickborne illness.  I have asked her to  start exercising regularly.  Will plan on seeing her back in the office in 3 months.    If she still has issues with shortness of breath with exertion which might be an anginal equivalent I would have a very low threshold to pursue stress testing  or coronary CT angiogram.     Medication Adjustments/Labs and Tests Ordered: Current medicines are reviewed at length with the patient today.  Concerns regarding medicines are outlined above.  No orders of the defined types were placed in this encounter.  No orders of the defined types were placed in this encounter.    Patient Instructions  Medication Instructions:  Your physician recommends that you continue on your current medications as directed. Please refer to the Current Medication list given to you today.  *If you need a refill on your cardiac medications before your next appointment, please call your pharmacy*   Lab Work: NONE If you have labs (blood work) drawn today and your tests are completely normal, you will receive your results only by: [place] (if you have MyChart) OR A paper copy in the mail If you have any lab test that is abnormal or we need to change your treatment, we will call you to review the results.   Testing/Procedures: NONE   Follow-Up: At [facility], you and your health needs are our priority.  As part of our continuing mission to provide you with exceptional heart care, we have created designated Provider Care Teams.  These Care Teams include your primary Cardiologist (physician) and Advanced Practice Providers (APPs -  Physician Assistants and Nurse Practitioners) who all work together to provide you with the care you need, when you need it.  We recommend signing up for the patient portal called "MyChart".  Sign up information is provided on this After Visit Summary.  MyChart is used to connect with patients for Virtual Visits (Telemedicine).  Patients are able to view lab/test results,  encounter notes, upcoming appointments, etc.  Non-urgent messages can be sent to your provider as well.   To learn more about what you can do with MyChart, go to [email or web address].    Your next appointment:   8 week(s)  Provider:   Mertie Moores, MD     Signed, Mertie Moores, MD  06/29/2022 9:50 PM    [place]

## 2022-07-15 ENCOUNTER — Ambulatory Visit: Payer: Medicare Other | Admitting: Cardiology

## 2022-07-17 DIAGNOSIS — M792 Neuralgia and neuritis, unspecified: Secondary | ICD-10-CM | POA: Diagnosis not present

## 2022-07-17 DIAGNOSIS — R002 Palpitations: Secondary | ICD-10-CM | POA: Diagnosis not present

## 2022-07-17 DIAGNOSIS — E782 Mixed hyperlipidemia: Secondary | ICD-10-CM | POA: Diagnosis not present

## 2022-07-17 DIAGNOSIS — W57XXXD Bitten or stung by nonvenomous insect and other nonvenomous arthropods, subsequent encounter: Secondary | ICD-10-CM | POA: Diagnosis not present

## 2022-07-17 DIAGNOSIS — R0609 Other forms of dyspnea: Secondary | ICD-10-CM | POA: Insufficient documentation

## 2022-07-17 DIAGNOSIS — E039 Hypothyroidism, unspecified: Secondary | ICD-10-CM | POA: Insufficient documentation

## 2022-08-22 ENCOUNTER — Encounter: Payer: Self-pay | Admitting: Cardiovascular Disease

## 2022-08-22 NOTE — Progress Notes (Signed)
Cardiology Office Note:    Date:  08/23/2022   ID:  Elizabeth Miranda, DOB 03-08-1951, MRN [id number]  PCP:  Elizabeth Squibb, MD   Elizabeth Miranda Cardiologist:  Elizabeth Miranda     Referring MD: Elizabeth Squibb, MD   Chief Complaint  Patient presents with   Shortness of Breath   Leg Swelling     History of Present Illness:    Elizabeth Miranda is a 72 y.o. female with a hx of palpitations,  DOE, exercise intolerance,  hypothyroidism.    Seen with daughter, Elizabeth Miranda.  In Sept. Noticed she would have palpitations at night when she was lying flat.   Thought the bed was vibrating.    Started having night sweats   Episodes typically at night when she is in bed.  Not during the day when she is busy .  Wakes her up out of sleep,   Does not snore,  No OSA   Had chest pain ,  does not feel like a heart attack according to her  No blood in stool.     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 Organic foods   No COVID vaccines    Family hx :   her fathers sister had heart issues .    Jan. 12, 2024  Elizabeth Miranda is seen for follow up of her severe DOE Echo  Dec. 12, 2023 revealed normal LV systolic function,  grade I DD, trivial MR  No structural cardiac issues to explain her dyspnea   Event monitor showed sinus rhythm, rare episodes of atrial tachycardia   Her DOE seems to be improving  Continues to have tachycardia  Has an [place] ring   Has tachycardia - she wakes up with fast HR on occasion .  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  Did a bioenergetic scan  ( hold electrodes, the software reads the bodies imbalances )   Now is having similar neuropathic pain .   Has RMSP this past summer - received 10 days of Doxycycline  Now her evidence of  Erlichia .   Now on doxycycline for 21  and medrol dose pack ( 6 day) .   We discuss seeing how she does over the next several months after her Medrol Dosepak and after her 3-week doxycycline course.  If she still has issues with shortness of breath with exertion which might be an anginal equivalent I would have a very low threshold to pursue stress testing or coronary CT angiogram.   August 23, 2022 Elizabeth Miranda is seen for follow up of her shortness of breath.  We discussed doing an ischemic work up if she has continued to have dyspnea ( ? Cor CTA )  Echocardiogram from December, 2023 reveals normal left ventricular systolic function.  She has grade 1 diastolic dysfunction and trivial mitral gravitation.  There is unlikely that her mild diastolic dysfunction is causing the severe shortness of breath.  She is having more shortness of breath Also has had several episodes of tachycardia  Event monitor from June 18, 2022 reveals sinus rhythm.  She has rare episodes of atrial tachycardia.  There is no evidence of atrial fibrillation.  Past Medical History:  Diagnosis Date   Hypothyroid    Palpitations     Past Surgical History:  Procedure Laterality Date   APPENDECTOMY     complete hysterectomy'     PARTIAL HYSTERECTOMY      Current Medications: Current Meds  Medication Sig   B Complex-C (B-COMPLEX WITH VITAMIN C) tablet Take 1 tablet by mouth daily.   cholecalciferol (VITAMIN D3) 25 MCG (1000 UNIT) tablet Take 5,000 Units by mouth daily.   doxycycline (VIBRA-TABS) 100 MG tablet Take 100 mg by mouth 2 (two) times daily.   ESTRADIOL PO Take 1.5 mg by mouth daily. Compound into a capsule. Per patient taking 0.5 mg   Estradiol-Estriol-Progesterone (BIEST/PROGESTERONE TD) Place onto the skin. 80/20 compound. Per patient taking 1.5 mg daily   levothyroxine (SYNTHROID, LEVOTHROID) 100 MCG tablet Take 1 tablet by mouth daily.   Melatonin 5 MG/15ML LIQD Take by mouth 3 (three)  times a week.   metFORMIN (GLUCOPHAGE-XR) 500 MG 24 hr tablet Take 1 tablet by mouth daily. Per patient taking for PCOS   metoprolol tartrate (LOPRESSOR) 25 MG tablet Take 1 tablet (25 mg total) by mouth 2 (two) times daily.   Multiple Vitamin (MULITIVITAMIN WITH MINERALS) TABS Take 1 tablet by mouth daily.   nitrofurantoin, macrocrystal-monohydrate, (MACROBID) 100 MG capsule Take 100 mg by mouth every 12 (twelve) hours.   Omega-3 Fatty Acids (OMEGA-3 PLUS PO) Take 2 capsules by mouth daily.     Allergies:   Codeine and Sulfa antibiotics   Social History   Socioeconomic History   Marital status: Married    Spouse name: Not on file   Number of children: Not on file   Years of education: Not on file   Highest education level: Not on file  Occupational History   Not on file  Tobacco Use   Smoking status: Never   Smokeless tobacco: Never  Substance and Sexual Activity   Alcohol use: No   Drug use: No   Sexual activity: Not on file  Other Topics Concern   Not on file  Social History Narrative   Not on file   Social Determinants of Health   Financial Resource Strain: Not on file  Food Insecurity: Not on file  Transportation Needs: Not on file  Physical Activity: Not on file  Stress: Not on file  Social Connections: Not on file     Family History: The patient's family history is not on file.  ROS:   Please see the history of present illness.     All other systems reviewed and are negative.  EKGs/Labs/Other Studies Reviewed:    The following studies were reviewed today:   EKG:    Recent Labs: 05/27/2022: ALT 21; BUN 13; Creatinine, Ser 0.75; Hemoglobin 14.2; Platelets 380; Potassium 4.1; Sodium 140; TSH 1.600  Recent Lipid Panel    Component Value Date/Time   CHOL 214 (H) 05/27/2022 1611   TRIG 186 (H) 05/27/2022 1611   HDL 52 05/27/2022 1611   CHOLHDL 4.1 05/27/2022 1611   LDLCALC 129 (H) 05/27/2022 1611     Risk Assessment/Calculations:         Physical Exam:     Physical Exam: Blood pressure 136/82, pulse 84, height '5\' 3"'$  (1.6 m), weight 156 lb 9.6 oz (71 kg), SpO2 100 %.       GEN:  Well nourished, well developed in no acute distress HEENT: Normal NECK: No JVD; No carotid bruits LYMPHATICS: No lymphadenopathy CARDIAC: RRR  [id number] systolic  murmur  RESPIRATORY:  Clear to auscultation without rales, wheezing or rhonchi  ABDOMEN: Soft, non-tender, non-distended MUSCULOSKELETAL:  No edema; No deformity  SKIN: Warm and dry NEUROLOGIC:  Alert and oriented x 3    ASSESSMENT:    1. Dyspnea, unspecified type   2. Coronary artery disease, unspecified vessel or lesion type, unspecified whether angina present, unspecified whether native or transplanted heart   3. Ectopic atrial rhythm      PLAN:       Shortness of breath with exertion: Echocardiogram reveals normal left ventricular systolic function.  She does have grade 1 diastolic dysfunction.  I do not think that we can explain her episodes of shortness of breath with her grade 1 diastolic dysfunction.  We will do a coronary CT angiogram to evaluate her for the possibility of ischemic heart disease.  2.  Ectopic atrial rhythm.  She had episodes of ectopic atrial rhythm.  Will start metoprolol 25 po BID   Will see her in 3 months           Medication Adjustments/Labs and Tests Ordered: Current medicines are reviewed at length with the patient today.  Concerns regarding medicines are outlined above.  Orders Placed This Encounter  Procedures   CT CORONARY MORPH W/CTA COR W/SCORE W/CA W/CM &/OR WO/CM   Basic metabolic panel   Meds ordered this encounter  Medications   metoprolol tartrate (LOPRESSOR) 25 MG tablet    Sig: Take 1 tablet (25 mg total) by mouth 2 (two) times daily.    Dispense:  180 tablet    Refill:  3     Patient Instructions  Medication Instructions:  Start Metoprolol Tart '25mg'$  twice daily *If you need a refill on your cardiac  medications before your next appointment, please call your pharmacy*   Lab Work: BMET Today If you have labs (blood work) drawn today and your tests are completely normal, you will receive your results only by: [place] (if you have MyChart) OR A paper copy in the mail If you have any lab test that is abnormal or we need to change your treatment, we will call you to review the results.   Testing/Procedures: Coronary CT Angiogram Your physician has requested that you have cardiac CT. Cardiac computed tomography (CT) is a painless test that uses an x-ray machine to take clear, detailed pictures of your heart. For further information please visit [email or web address]. Please follow instruction sheet as given.     Follow-Up: At [facility], you and your health needs are our priority.  As part of our continuing mission to provide you with exceptional heart care, we have created designated Provider Care Teams.  These Care Teams include your primary Cardiologist (physician) and Advanced Practice Miranda (APPs -  Physician Assistants and Nurse Practitioners) who all work together to provide you with the care you need, when you need it.  We recommend signing up for the patient portal called "MyChart".  Sign up information is provided on this After Visit Summary.  MyChart is used to connect with patients for Virtual Visits (Telemedicine).  Patients are able to view lab/test results, encounter notes, upcoming appointments, etc.  Non-urgent messages can be sent to your provider as well.   To learn more about what you can do with MyChart, go to [email or web address].    Your next appointment:   3 month(s)  Provider:   Mertie Moores, MD     Other Instructions   Your cardiac CT will be scheduled at:  [facility] [place], [place] [address] [phone]  Please arrive at the [facility] and Children's Entrance (Entrance C2) of [facility] 30  minutes prior to test start time. You can use the FREE valet parking offered at entrance C (encouraged to control the heart rate for the test)  Proceed to the [facility] Radiology Department (first floor) to check-in and test prep.  All radiology patients and guests should use entrance C2 at [facility], accessed from [facility], even though the hospital's physical address listed is [address].       Please follow these instructions carefully (unless otherwise directed):   On the Night Before the Test: Be sure to Drink plenty of water. Do not consume any caffeinated/decaffeinated beverages or chocolate 12 hours prior to your test. Do not take any antihistamines 12 hours prior to your test.  On the Day of the Test: Drink plenty of water until 1 hour prior to the test. Do not eat any food 1 hour prior to test. You may take your regular medications prior to the test.  Take metoprolol 4 tablets ('100mg'$ ) (Lopressor) two hours prior to test. FEMALES- please wear underwire-free bra if available, avoid dresses & tight clothing        After the Test: Drink plenty of water. After receiving IV contrast, you may experience a mild flushed feeling. This is normal. On occasion, you may experience a mild rash up to 24 hours after the test. This is not dangerous. If this occurs, you can take Benadryl 25 mg and increase your fluid intake. If you experience trouble breathing, this can be serious. If it is severe call 911 IMMEDIATELY. If it is mild, please call our office. If you take any of these medications: Glipizide/Metformin, Avandament, Glucavance, please do not take 48 hours after completing test unless otherwise instructed.  We will call to schedule your test 2-4 weeks out understanding that some insurance companies will need an authorization prior to the service being performed.   For non-scheduling related questions, please contact the cardiac imaging nurse  navigator should you have any questions/concerns: Marchia Bond, Cardiac Imaging Nurse Navigator Gordy Clement, Cardiac Imaging Nurse Navigator [facility] Direct Office Dial: [phone]   For scheduling needs, including cancellations and rescheduling, please call [place], [phone].     Signed, Mertie Moores, MD  08/23/2022 1:07 PM    [place]

## 2022-08-23 ENCOUNTER — Ambulatory Visit: Payer: Medicare Other | Attending: Cardiovascular Disease | Admitting: Cardiovascular Disease

## 2022-08-23 VITALS — BP 136/82 | HR 84 | Ht 63.0 in | Wt 156.6 lb

## 2022-08-23 DIAGNOSIS — R0600 Dyspnea, unspecified: Secondary | ICD-10-CM

## 2022-08-23 DIAGNOSIS — I2510 Atherosclerotic heart disease of native coronary artery without angina pectoris: Secondary | ICD-10-CM

## 2022-08-23 DIAGNOSIS — I491 Atrial premature depolarization: Secondary | ICD-10-CM

## 2022-08-23 MED ORDER — METOPROLOL TARTRATE 25 MG PO TABS: 25.0000 mg | ORAL_TABLET | Freq: Two times a day (BID) | ORAL | 3 refills | Status: DC

## 2022-08-23 NOTE — Patient Instructions (Addendum)
Medication Instructions:  Start Metoprolol Tart '25mg'$  twice daily *If you need a refill on your cardiac medications before your next appointment, please call your pharmacy*   Lab Work: BMET Today If you have labs (blood work) drawn today and your tests are completely normal, you will receive your results only by: [place] (if you have MyChart) OR A paper copy in the mail If you have any lab test that is abnormal or we need to change your treatment, we will call you to review the results.   Testing/Procedures: Coronary CT Angiogram Your physician has requested that you have cardiac CT. Cardiac computed tomography (CT) is a painless test that uses an x-ray machine to take clear, detailed pictures of your heart. For further information please visit [email or web address]. Please follow instruction sheet as given.     Follow-Up: At [facility], you and your health needs are our priority.  As part of our continuing mission to provide you with exceptional heart care, we have created designated Provider Care Teams.  These Care Teams include your primary Cardiologist (physician) and Advanced Practice Providers (APPs -  Physician Assistants and Nurse Practitioners) who all work together to provide you with the care you need, when you need it.  We recommend signing up for the patient portal called "MyChart".  Sign up information is provided on this After Visit Summary.  MyChart is used to connect with patients for Virtual Visits (Telemedicine).  Patients are able to view lab/test results, encounter notes, upcoming appointments, etc.  Non-urgent messages can be sent to your provider as well.   To learn more about what you can do with MyChart, go to [email or web address].    Your next appointment:   3 month(s)  Provider:   Mertie Moores, MD     Other Instructions   Your cardiac CT will be scheduled at:  [facility] [address] [place], [place] [address] [phone]  Please arrive at the [facility] and Children's Entrance (Entrance C2) of [facility] 30 minutes prior to test start time. You can use the FREE valet parking offered at entrance C (encouraged to control the heart rate for the test)  Proceed to the [facility] Radiology Department (first floor) to check-in and test prep.  All radiology patients and guests should use entrance C2 at [facility], accessed from [facility], even though the hospital's physical address listed is [address]       Please follow these instructions carefully (unless otherwise directed):   On the Night Before the Test: Be sure to Drink plenty of water. Do not consume any caffeinated/decaffeinated beverages or chocolate 12 hours prior to your test. Do not take any antihistamines 12 hours prior to your test.  On the Day of the Test: Drink plenty of water until 1 hour prior to the test. Do not eat any food 1 hour prior to test. You may take your regular medications prior to the test.  Take metoprolol 4 tablets ('100mg'$ ) (Lopressor) two hours prior to test. FEMALES- please wear underwire-free bra if available, avoid dresses & tight clothing        After the Test: Drink plenty of water. After receiving IV contrast, you may experience a mild flushed feeling. This is normal. On occasion, you may experience a mild rash up to 24 hours after the test. This is not dangerous. If this occurs, you can take Benadryl 25 mg and increase your fluid intake. If you experience  trouble breathing, this can be serious. If it is severe call 911 IMMEDIATELY. If it is mild, please call our office. If you take any of these medications: Glipizide/Metformin, Avandament, Glucavance, please do not take 48 hours after completing test unless otherwise instructed.  We will call to schedule your test 2-4 weeks out understanding that some insurance companies will need an authorization prior to the  service being performed.   For non-scheduling related questions, please contact the cardiac imaging nurse navigator should you have any questions/concerns: Marchia Bond, Cardiac Imaging Nurse Navigator Gordy Clement, Cardiac Imaging Nurse Navigator [facility] Direct Office Dial: [phone]   For scheduling needs, including cancellations and rescheduling, please call [place], [phone].

## 2022-08-24 LAB — BASIC METABOLIC PANEL

## 2022-08-26 ENCOUNTER — Telehealth: Payer: Self-pay | Admitting: Cardiovascular Disease

## 2022-08-26 DIAGNOSIS — Z01810 Encounter for preprocedural cardiovascular examination: Secondary | ICD-10-CM

## 2022-08-26 NOTE — Telephone Encounter (Signed)
Patient calling in bout her labs, calling to see if she needs to retake them. Please advise

## 2022-08-26 NOTE — Telephone Encounter (Signed)
Left voicemail for patient to return call to office. 

## 2022-08-27 DIAGNOSIS — Z01810 Encounter for preprocedural cardiovascular examination: Secondary | ICD-10-CM | POA: Diagnosis not present

## 2022-08-27 NOTE — Telephone Encounter (Signed)
Pt returning call about labs  

## 2022-08-27 NOTE — Telephone Encounter (Signed)
Reached out to patient. BMET done 4 days ago shows "cancelled." Advised patient that while I'm not 1000%sure the cause, it means that for some reason that sample could not be used (hemolyzed, too little/much blood, wrong tube, etc). She understands. Repeat BMET order placed and released so that she can have drawn at [facility] near her house before her scheduled CT on Monday. No further questions.

## 2022-08-27 NOTE — Addendum Note (Signed)
Addended by: Ma Hillock on: 08/27/2022 01:20 PM   Modules accepted: Orders

## 2022-08-28 LAB — BASIC METABOLIC PANEL
BUN/Creatinine Ratio: 29 — ABNORMAL HIGH (ref 12–28)
BUN: 19 mg/dL (ref 8–27)
CO2: 20 mmol/L (ref 20–29)
Calcium: 9.3 mg/dL (ref 8.7–10.3)
Chloride: 104 mmol/L (ref 96–106)
Creatinine, Ser: 0.66 mg/dL (ref 0.57–1.00)
Glucose: 96 mg/dL (ref 70–99)
Potassium: 4.3 mmol/L (ref 3.5–5.2)
Sodium: 140 mmol/L (ref 134–144)
eGFR: 93 mL/min/{1.73_m2} (ref >59)

## 2022-08-30 ENCOUNTER — Telehealth (HOSPITAL_COMMUNITY): Payer: Self-pay | Admitting: *Deleted

## 2022-08-30 NOTE — Telephone Encounter (Signed)
Patient returning call about her upcoming cardiac imaging study; pt verbalizes understanding of appt date/time, parking situation and where to check in, pre-test NPO status and medications ordered, and verified current allergies; name and call back number provided for further questions should they arise  Alley Neils RN Navigator Cardiac Imaging [facility] [phone] office [phone] cell  Patient to take 100mg metoprolol tartrate two hours prior to her cardiac CT scan. She is aware to arrive at 12pm 

## 2022-08-30 NOTE — Telephone Encounter (Signed)
Attempted to call patient regarding upcoming cardiac CT appointment. °Left message on voicemail with name and callback number ° °Gaylan Fauver RN Navigator Cardiac Imaging °[facility] °[phone] Office °[phone] Cell ° °

## 2022-09-02 ENCOUNTER — Ambulatory Visit (HOSPITAL_COMMUNITY)
Admission: RE | Admit: 2022-09-02 | Discharge: 2022-09-02 | Disposition: A | Discharge status: Home / Self Care | Payer: Medicare Other | Source: Ambulatory Visit | Attending: Cardiovascular Disease | Admitting: Cardiovascular Disease

## 2022-09-02 DIAGNOSIS — I2510 Atherosclerotic heart disease of native coronary artery without angina pectoris: Secondary | ICD-10-CM

## 2022-09-02 DIAGNOSIS — R0600 Dyspnea, unspecified: Secondary | ICD-10-CM

## 2022-09-02 MED ORDER — NITROGLYCERIN 0.4 MG SL SUBL
SUBLINGUAL_TABLET | SUBLINGUAL | Status: AC
  Filled 2022-09-02: qty 2

## 2022-09-02 MED ORDER — IOHEXOL 350 MG/ML SOLN
100.0000 mL | Freq: Once | INTRAVENOUS | Status: AC | PRN
  Administered 2022-09-02: 100 mL via INTRAVENOUS

## 2022-09-02 MED ORDER — NITROGLYCERIN 0.4 MG SL SUBL
0.8000 mg | SUBLINGUAL_TABLET | Freq: Once | SUBLINGUAL | Status: AC
  Administered 2022-09-02: 0.8 mg via SUBLINGUAL

## 2022-09-03 ENCOUNTER — Telehealth: Payer: Self-pay

## 2022-09-03 NOTE — Telephone Encounter (Signed)
-----   Message from Thayer Headings, MD sent at 09/02/2022  3:46 PM EDT ----- Coronary calcium score is 0 Normal coronary arteries

## 2022-09-03 NOTE — Telephone Encounter (Signed)
BMP results WNL reviewed with patient who verbalizes understanding.

## 2022-09-03 NOTE — Telephone Encounter (Signed)
-----   Message from Thayer Headings, MD sent at 09/01/2022  7:10 AM EDT ----- BMP is WNL

## 2022-09-03 NOTE — Telephone Encounter (Signed)
Called patient to give coronary calcium score results, no answer, left detailed message per [facility] detailing that coronary arteries were normal on CT. Advised patient to call main office if she has any questions.

## 2022-09-26 ENCOUNTER — Other Ambulatory Visit: Payer: Self-pay

## 2022-09-26 ENCOUNTER — Other Ambulatory Visit (HOSPITAL_BASED_OUTPATIENT_CLINIC_OR_DEPARTMENT_OTHER): Payer: Self-pay

## 2022-09-26 ENCOUNTER — Emergency Department (HOSPITAL_BASED_OUTPATIENT_CLINIC_OR_DEPARTMENT_OTHER): Payer: Medicare Other | Admitting: Radiology

## 2022-09-26 ENCOUNTER — Telehealth: Payer: Self-pay | Admitting: Cardiovascular Disease

## 2022-09-26 ENCOUNTER — Emergency Department (HOSPITAL_BASED_OUTPATIENT_CLINIC_OR_DEPARTMENT_OTHER)
Admission: EM | Admit: 2022-09-26 | Discharge: 2022-09-26 | Disposition: A | Discharge status: Home / Self Care | Payer: Medicare Other | Attending: Emergency Medicine | Admitting: Emergency Medicine

## 2022-09-26 ENCOUNTER — Encounter (HOSPITAL_BASED_OUTPATIENT_CLINIC_OR_DEPARTMENT_OTHER): Payer: Self-pay

## 2022-09-26 DIAGNOSIS — I10 Essential (primary) hypertension: Secondary | ICD-10-CM | POA: Diagnosis not present

## 2022-09-26 DIAGNOSIS — R0602 Shortness of breath: Secondary | ICD-10-CM | POA: Diagnosis not present

## 2022-09-26 DIAGNOSIS — E039 Hypothyroidism, unspecified: Secondary | ICD-10-CM | POA: Insufficient documentation

## 2022-09-26 DIAGNOSIS — Z7984 Long term (current) use of oral hypoglycemic drugs: Secondary | ICD-10-CM | POA: Insufficient documentation

## 2022-09-26 DIAGNOSIS — R0609 Other forms of dyspnea: Secondary | ICD-10-CM

## 2022-09-26 DIAGNOSIS — R110 Nausea: Secondary | ICD-10-CM | POA: Insufficient documentation

## 2022-09-26 DIAGNOSIS — Z79899 Other long term (current) drug therapy: Secondary | ICD-10-CM | POA: Insufficient documentation

## 2022-09-26 DIAGNOSIS — R531 Weakness: Secondary | ICD-10-CM | POA: Insufficient documentation

## 2022-09-26 LAB — BRAIN NATRIURETIC PEPTIDE: B Natriuretic Peptide: 7.8 pg/mL (ref 0.0–100.0)

## 2022-09-26 LAB — BASIC METABOLIC PANEL
Anion gap: 11 (ref 5–15)
BUN: 15 mg/dL (ref 8–23)
CO2: 23 mmol/L (ref 22–32)
Calcium: 10 mg/dL (ref 8.9–10.3)
Chloride: 103 mmol/L (ref 98–111)
Creatinine, Ser: 0.6 mg/dL (ref 0.44–1.00)
GFR, Estimated: >60 mL/min (ref >60)
Glucose, Bld: 84 mg/dL (ref 70–99)
Potassium: 3.8 mmol/L (ref 3.5–5.1)
Sodium: 137 mmol/L (ref 135–145)

## 2022-09-26 LAB — CBC
HCT: 41.2 % (ref 36.0–46.0)
Hemoglobin: 14.3 g/dL (ref 12.0–15.0)
MCH: 30.5 pg (ref 26.0–34.0)
MCHC: 34.7 g/dL (ref 30.0–36.0)
MCV: 87.8 fL (ref 80.0–100.0)
Platelets: 343 10*3/uL (ref 150–400)
RBC: 4.69 MIL/uL (ref 3.87–5.11)
RDW: 12.3 % (ref 11.5–15.5)
WBC: 5.4 10*3/uL (ref 4.0–10.5)
nRBC: 0 % (ref 0.0–0.2)

## 2022-09-26 LAB — TROPONIN I (HIGH SENSITIVITY): Troponin I (High Sensitivity): 3 ng/L (ref <18)

## 2022-09-26 LAB — D-DIMER, QUANTITATIVE: D-Dimer, Quant: 0.33 ug/mL-FEU (ref 0.00–0.50)

## 2022-09-26 MED ORDER — LOSARTAN POTASSIUM 25 MG PO TABS: 25.0000 mg | ORAL_TABLET | Freq: Every day | ORAL | 0 refills | Status: DC

## 2022-09-26 MED ORDER — LOSARTAN POTASSIUM 25 MG PO TABS
25.0000 mg | ORAL_TABLET | Freq: Once | ORAL | Status: AC
  Administered 2022-09-26: 25 mg via ORAL
  Filled 2022-09-26: qty 1

## 2022-09-26 NOTE — ED Notes (Signed)
RT ambulated pt with pulse ox. Patient's O2 saturation remained 97-99% however she felt very weak and unsteady on her feet, we then returned to her room so she could sit down. Dr Theresia Lo was notified.

## 2022-09-26 NOTE — ED Notes (Signed)
RN reviewed discharge instructions with pt. Pt verbalized understanding and had no further questions. VSS upon discharge.  

## 2022-09-26 NOTE — ED Triage Notes (Signed)
Patient arrives to ED POV C/O SHOB x3 months. Per pt she gets real [facility] with activity and feels like her heart is racing. Pt also states she has been going to cardiologist and pulmonologist who can't figure out why she is [facility]. No other complaints at this time. Pt is A/O x4.

## 2022-09-26 NOTE — Discharge Instructions (Signed)
You were seen in the emergency department for your shortness of breath.  Your workup here showed no signs of heart attack, heart failure, fluid on your lungs, no evidence of blood clots and no signs of pneumonia.  It is unclear what is causing your symptoms at this time but you should follow-up in the pulmonary clinic with your ongoing symptoms to have further workup and can follow-up with your primary doctor as well.  You should return to the emergency department if you are having worsening shortness of breath, severe chest pains, you pass out or if you have any other new or concerning symptoms.

## 2022-09-26 NOTE — Telephone Encounter (Signed)
Patient's son Elizabeth Miranda is calling stating the ED recommended a bubble echo or doppler. There are requesting Dr. Elease Hashimoto be the ordering provider to have this scheduled at our practice. He states they are also concerned with her going home with her BP being so high and they recommended our office be called. Please advise.

## 2022-09-26 NOTE — ED Provider Notes (Addendum)
[place] [facility]ERGENCY DEPARTMENT AT [facility] Provider Note   CSN: [id number] Arrival date & time: 09/26/22  1694     History  Chief Complaint  Patient presents with   Shortness of Breath    Elizabeth Miranda is a 72 y.o. female.  Patient is a 72 year old female with a past medical history of hypothyroidism presenting to the emergency department with shortness of breath.  Patient states over the last 3 months she has had increasing dyspnea on exertion.  She states that she has been seen by cardiology and had an echo and a CT angiogram of her heart performed that showed no explanation of her symptoms.  She states that over the weekend her symptoms progressively worsened where she had a near syncopal episode on Saturday.  She states that she was walking and became very lightheaded, diaphoretic and nauseous.  She states that she has been having increasing shortness of breath on exertion that she is having difficulty with walking across the room more up than more than 1 or 2 steps before getting short of breath.  She denies any history of blood clots but is on estrogen supplement, she denies any recent hospitalization or surgery, recent long travel in the car or plane or cancer history.  The history is provided by the patient and a relative.  Shortness of Breath      Home Medications Prior to Admission medications   Medication Sig Start Date End Date Taking? Authorizing Provider  B Complex-C (B-COMPLEX WITH VITAMIN C) tablet Take 1 tablet by mouth daily.   Yes [provider]  cholecalciferol (VITAMIN D3) 25 MCG (1000 UNIT) tablet Take 5,000 Units by mouth daily.   Yes [provider]  ESTRADIOL PO Take 1.5 mg by mouth daily. Compound into a capsule. Per patient taking 0.5 mg   Yes [provider]  Estradiol-Estriol-Progesterone (BIEST/PROGESTERONE TD) Place onto the skin. 80/20 compound. Per patient taking 1.5 mg daily   Yes [provider]   levothyroxine (SYNTHROID, LEVOTHROID) 100 MCG tablet Take 1 tablet by mouth daily. 06/29/14  Yes [provider]  liothyronine (CYTOMEL) 25 MCG tablet Take 25 mcg by mouth daily. 09/21/22  Yes [provider]  losartan (COZAAR) 25 MG tablet Take 1 tablet (25 mg total) by mouth daily. 09/26/22  Yes Elayne Snare K, DO  Melatonin 5 MG/15ML LIQD Take 1 mg by mouth at bedtime as needed. spray   Yes [provider]  metFORMIN (GLUCOPHAGE-XR) 500 MG 24 hr tablet Take 500 mg by mouth 2 (two) times daily. Per patient taking for PCOS   Yes [provider]  Multiple Vitamin (MULITIVITAMIN WITH MINERALS) TABS Take 1 tablet by mouth daily.   Yes [provider]  metoprolol tartrate (LOPRESSOR) 25 MG tablet Take 1 tablet (25 mg total) by mouth 2 (two) times daily. Patient not taking: Reported on 09/26/2022 08/23/22   Nahser, Deloris Ping, MD      Allergies    Codeine and Sulfa antibiotics    Review of Systems   Review of Systems  Respiratory:  Positive for shortness of breath.     Physical Exam Updated Vital Signs BP (!) 173/65   Pulse 74   Temp 98 F (36.7 C) (Oral)   Resp (!) 21   Ht 5\' 3"  (1.6 m)   Wt 68.9 kg   SpO2 99%   BMI 26.93 kg/m  Physical Exam Vitals and nursing note reviewed.  Constitutional:      General: She  is not in acute distress.    Appearance: She is well-developed.  HENT:     Head: Normocephalic and atraumatic.     Mouth/Throat:     Mouth: Mucous membranes are moist.     Pharynx: Oropharynx is clear.  Eyes:     Extraocular Movements: Extraocular movements intact.  Cardiovascular:     Rate and Rhythm: Normal rate and regular rhythm.     Heart sounds: Normal heart sounds.  Pulmonary:     Effort: Pulmonary effort is normal.     Breath sounds: Normal breath sounds.  Abdominal:     Palpations: Abdomen is soft.     Tenderness: There is no abdominal tenderness.  Musculoskeletal:        General: Normal range of motion.      Cervical back: Normal range of motion and neck supple.     Right lower leg: No edema.     Left lower leg: No edema.  Skin:    General: Skin is warm and dry.  Neurological:     General: No focal deficit present.     Mental Status: She is alert and oriented to person, place, and time.  Psychiatric:        Mood and Affect: Mood normal.        Behavior: Behavior normal.     ED Results / Procedures / Treatments   Labs (all labs ordered are listed, but only abnormal results are displayed) Labs Reviewed  BASIC METABOLIC PANEL  CBC  BRAIN NATRIURETIC PEPTIDE  D-DIMER, QUANTITATIVE  TROPONIN I (HIGH SENSITIVITY)    EKG None  Radiology DG Chest 2 View  Result Date: 09/26/2022 CLINICAL DATA:  Shortness of breath with activity over the last 3 months. Symptoms are progressing. EXAM: CHEST - 2 VIEW COMPARISON:  None Available. FINDINGS: The heart size and mediastinal contours are within normal limits. Both lungs are clear. The visualized skeletal structures are unremarkable. IMPRESSION: Negative two view chest x-ray Electronically Signed   By: Marin Roberts M.D.   On: 09/26/2022 10:30    Procedures Procedures    Medications Ordered in ED Medications  losartan (COZAAR) tablet 25 mg (25 mg Oral Given 09/26/22 1328)    ED Course/ Medical Decision Making/ A&P Clinical Course as of 09/26/22 1448  Thu Sep 26, 2022  1149 Labs within normal range, no signs of anemia or cardiac dysfunction, CXR negative. She will have amb O2 [VK]  1243 No desaturations on ambulatory O2 but did feel very weak and BP elevated.  Patient states that she felt like she was about to fall or pass out and does not feel like she can safely care for herself at home due to the progressive worsening of her symptoms.  The patient will be admitted for further management of her shortness of breath and generalized weakness. [VK]  1358 I spoke with Dr. Jomarie Longs who recommended outpatient follow up with pulmonology for PFTs  and does not meet admission criteria at this time. [VK]  1444 I informed the patient and family that she does not meet admission criteria at this time and recommend pulmonary follow up. She was given strict return precautions. [VK]    Clinical Course User Index [VK] Rexford Maus, DO                             Medical Decision Making This patient presents to the ED with chief complaint(s) of shortness of breath, weakness  with pertinent past medical history of hypothyroidism which further complicates the presenting complaint. The complaint involves an extensive differential diagnosis and also carries with it a high risk of complications and morbidity.    The differential diagnosis includes ACS, arrhythmia, anemia, pulmonary edema, pleural effusion, pneumonia, pneumothorax, CHF, considering PE with her history of hormone use  Additional history obtained: Additional history obtained from family Records reviewed Primary Care Documents and outpatient cardiology records  ED Course and Reassessment: On patient's arrival she is well-appearing satting well on room air in no acute distress.  Patient's EKG on arrival had no acute ischemic changes.  She will have labs including troponin, BNP and D-dimer as well as chest x-ray performed.  She will have ambulatory pulse ox and will be closely reassessed.  Independent labs interpretation:  The following labs were independently interpreted: Within normal range  Independent visualization of imaging: - I independently visualized the following imaging with scope of interpretation limited to determining acute life threatening conditions related to emergency care: Chest x-ray, which revealed no acute disease  Consultation: - Consulted or discussed management/test interpretation w/ external professional: Hospitalists  Consideration for admission or further workup: Patient requires admission for her generalized weakness and dyspnea on exertion Social  Determinants of health: N/A    Amount and/or Complexity of Data Reviewed Labs: ordered. Radiology: ordered.  Risk Prescription drug management. Decision regarding hospitalization.          Final Clinical Impression(s) / ED Diagnoses Final diagnoses:  Generalized weakness  Dyspnea on exertion  Hypertension, unspecified type    Rx / DC Orders ED Discharge Orders          Ordered    losartan (COZAAR) 25 MG tablet  Daily        09/26/22 1446              Rexford MausKingsley, Iasia Forcier K, DO 09/26/22 1334    Rexford MausKingsley, Wanette Robison K, DO 09/26/22 1448

## 2022-09-27 ENCOUNTER — Encounter (HOSPITAL_COMMUNITY): Payer: Self-pay

## 2022-09-27 ENCOUNTER — Emergency Department (HOSPITAL_COMMUNITY)
Admission: EM | Admit: 2022-09-27 | Discharge: 2022-09-27 | Disposition: A | Discharge status: Home / Self Care | Payer: Medicare Other | Attending: Emergency Medicine | Admitting: Emergency Medicine

## 2022-09-27 ENCOUNTER — Other Ambulatory Visit: Payer: Self-pay

## 2022-09-27 ENCOUNTER — Emergency Department (HOSPITAL_COMMUNITY): Payer: Medicare Other

## 2022-09-27 ENCOUNTER — Telehealth: Payer: Self-pay | Admitting: Cardiovascular Disease

## 2022-09-27 DIAGNOSIS — R0602 Shortness of breath: Secondary | ICD-10-CM | POA: Insufficient documentation

## 2022-09-27 DIAGNOSIS — R9389 Abnormal findings on diagnostic imaging of other specified body structures: Secondary | ICD-10-CM | POA: Diagnosis not present

## 2022-09-27 DIAGNOSIS — E039 Hypothyroidism, unspecified: Secondary | ICD-10-CM | POA: Diagnosis not present

## 2022-09-27 DIAGNOSIS — R55 Syncope and collapse: Secondary | ICD-10-CM | POA: Diagnosis not present

## 2022-09-27 DIAGNOSIS — R29818 Other symptoms and signs involving the nervous system: Secondary | ICD-10-CM | POA: Diagnosis not present

## 2022-09-27 DIAGNOSIS — R079 Chest pain, unspecified: Secondary | ICD-10-CM | POA: Diagnosis not present

## 2022-09-27 LAB — CBC
HCT: 42.6 % (ref 36.0–46.0)
Hemoglobin: 14 g/dL (ref 12.0–15.0)
MCH: 29.5 pg (ref 26.0–34.0)
MCHC: 32.9 g/dL (ref 30.0–36.0)
MCV: 89.9 fL (ref 80.0–100.0)
Platelets: 347 10*3/uL (ref 150–400)
RBC: 4.74 MIL/uL (ref 3.87–5.11)
RDW: 12.3 % (ref 11.5–15.5)
WBC: 5.8 10*3/uL (ref 4.0–10.5)
nRBC: 0 % (ref 0.0–0.2)

## 2022-09-27 LAB — BASIC METABOLIC PANEL
Anion gap: 11 (ref 5–15)
BUN: 14 mg/dL (ref 8–23)
CO2: 23 mmol/L (ref 22–32)
Calcium: 9.2 mg/dL (ref 8.9–10.3)
Chloride: 101 mmol/L (ref 98–111)
Creatinine, Ser: 0.78 mg/dL (ref 0.44–1.00)
GFR, Estimated: >60 mL/min (ref >60)
Glucose, Bld: 89 mg/dL (ref 70–99)
Potassium: 4 mmol/L (ref 3.5–5.1)
Sodium: 135 mmol/L (ref 135–145)

## 2022-09-27 LAB — TROPONIN I (HIGH SENSITIVITY)
Troponin I (High Sensitivity): 11 ng/L (ref <18)
Troponin I (High Sensitivity): 10 ng/L (ref <18)

## 2022-09-27 MED ORDER — LORAZEPAM 2 MG/ML IJ SOLN
1.0000 mg | Freq: Once | INTRAMUSCULAR | Status: AC | PRN
  Administered 2022-09-27: 1 mg via INTRAVENOUS
  Filled 2022-09-27: qty 1

## 2022-09-27 NOTE — ED Provider Notes (Signed)
Signout from [employer] at shift change. Briefly, patient presents for ongoing shortness of breath.  She had an episode of syncope while in radiology today.  CT with some abnormalities but no acute stroke, pending MRI at this time.   Plan: MRI   7:06 PM Reassessment performed. Patient appears stable, a little sleepy from the Ativan she received earlier.  Family at bedside.  Questions answered.  Labs and imaging personally reviewed and interpreted including: CBC, BMP, troponin x 2 unremarkable.  Reviewed MRI results.   Most current vital signs reviewed and are as follows: BP (!) 146/81   Pulse 80   Temp 98.1 F (36.7 C) (Oral)   Resp (!) 22   Ht 5\' 3"  (1.6 m)   Wt 68.9 kg   SpO2 96%   BMI 26.93 kg/m     Plan: Patient cleared for discharge to home tonight.  Previous provider touch base with cardiology and patient has had recent reassuring workup.  She has had some right-sided symptoms since January including some residual hand weakness.  Given her abnormal MRI tonight, feel that she would benefit from following up with neurology as an outpatient.  I placed a referral.  I have also given contact information for pulmonology for shortness of breath workup.   Return and follow-up instructions: Patient counseled to return if they have weakness in their arms or legs, slurred speech, trouble walking or talking, confusion, trouble with their balance, or if they have any other concerns. Patient verbalizes understanding and agrees with plan.     Renne Crigler, PA-C 09/27/22 1908    Wynetta Fines, MD 09/27/22 [phone]

## 2022-09-27 NOTE — ED Notes (Signed)
Patient transported to MRI 

## 2022-09-27 NOTE — Discharge Instructions (Signed)
Please read and follow all provided instructions.  Your diagnoses today include:  1. Shortness of breath   2. Syncope, unspecified syncope type   3. Abnormal MRI     Tests performed today include: An EKG of your heart A chest x-ray Cardiac enzymes - a blood test for heart muscle damage Blood counts and electrolytes MRI of the brain: Showed a small, old hemorrhage in the left occipital area of the brain Vital signs. See below for your results today.   Medications prescribed:  None  Take any prescribed medications only as directed.  Follow-up instructions: Please follow-up with your primary care provider next week for recheck.  I have also placed a referral to neurology on your behalf and provided referral information for pulmonology to call on Monday.   Return instructions:  SEEK IMMEDIATE MEDICAL ATTENTION IF: You have severe chest pain, especially if the pain is crushing or pressure-like and spreads to the arms, back, neck, or jaw, or if you have sweating, nausea or vomiting, or trouble with breathing. THIS IS AN EMERGENCY. Do not wait to see if the pain will go away. Get medical help at once. Call 911. DO NOT drive yourself to the hospital.  Return if you have weakness in your arms or legs, slurred speech, trouble walking or talking, confusion, or trouble with your balance.  Your chest pain gets worse and does not go away after a few minutes of rest.  You have an attack of chest pain lasting longer than what you usually experience.  You have significant dizziness, if you pass out, or have trouble walking.  You have chest pain not typical of your usual pain for which you originally saw your caregiver.  You have any other emergent concerns regarding your health.    Your vital signs today were: BP (!) 146/81   Pulse 80   Temp 98.1 F (36.7 C) (Oral)   Resp (!) 22   Ht 5\' 3"  (1.6 m)   Wt 68.9 kg   SpO2 96%   BMI 26.93 kg/m  If your blood pressure (BP) was elevated  above 135/85 this visit, please have this repeated by your doctor within one month. --------------

## 2022-09-27 NOTE — ED Provider Notes (Signed)
[place] EMERGENCY DEPARTMENT AT [facility] Provider Note   CSN: [id number] Arrival date & time: 09/27/22  1005     History {Add pertinent medical, surgical, social history, OB history to HPI:1} Chief Complaint  Patient presents with   Chest Pain   HPI Elizabeth Miranda is a 72 y.o. female with hypothyroidism presenting for chest pain.  States chest pain has been going on for several months.  Has been noticeably worse in the last couple weeks.  Also endorses associated exertional shortness of breath.  States it is worse with breathing.  It feels sharp.  It is midsternal and nonradiating.  Was seen at [facility] yesterday for same.  Is closely followed by cardiology and has had a recent echo and CT angiogram of her heart which showed an explanation for symptoms.  Patient also stated that she had a syncopal episode while receiving her chest x-ray here today.  Denies loss of consciousness and head injury.  States the x-ray tech called her as she was falling.   Chest Pain      Home Medications Prior to Admission medications   Medication Sig Start Date End Date Taking? Authorizing Provider  B Complex-C (B-COMPLEX WITH VITAMIN C) tablet Take 1 tablet by mouth daily.    [provider]  cholecalciferol (VITAMIN D3) 25 MCG (1000 UNIT) tablet Take 5,000 Units by mouth daily.    [provider]  ESTRADIOL PO Take 1.5 mg by mouth daily. Compound into a capsule. Per patient taking 0.5 mg    [provider]  Estradiol-Estriol-Progesterone (BIEST/PROGESTERONE TD) Place onto the skin. 80/20 compound. Per patient taking 1.5 mg daily    [provider]  levothyroxine (SYNTHROID, LEVOTHROID) 100 MCG tablet Take 1 tablet by mouth daily. 06/29/14   [provider]  liothyronine (CYTOMEL) 25 MCG tablet Take 25 mcg by mouth daily. 09/21/22   [provider]  losartan (COZAAR) 25 MG tablet Take 1 tablet (25 mg total) by mouth daily. 09/26/22    Rexford Maus, DO  Melatonin 5 MG/15ML LIQD Take 1 mg by mouth at bedtime as needed. spray    [provider]  metFORMIN (GLUCOPHAGE-XR) 500 MG 24 hr tablet Take 500 mg by mouth 2 (two) times daily. Per patient taking for PCOS    [provider]  metoprolol tartrate (LOPRESSOR) 25 MG tablet Take 1 tablet (25 mg total) by mouth 2 (two) times daily. Patient not taking: Reported on 09/26/2022 08/23/22   Nahser, Deloris Ping, MD  Multiple Vitamin (MULITIVITAMIN WITH MINERALS) TABS Take 1 tablet by mouth daily.    [provider]      Allergies    Codeine and Sulfa antibiotics    Review of Systems   Review of Systems  Cardiovascular:  Positive for chest pain.    Physical Exam   Vitals:   09/27/22 1300 09/27/22 1437  BP: 131/69   Pulse: 72   Resp: 20   Temp:  98.2 F (36.8 C)  SpO2: 98%     CONSTITUTIONAL:  well-appearing, NAD NEURO:  GCS 15. Speech is goal oriented. No deficits appreciated to CN III-XII; symmetric eyebrow raise, no facial drooping, tongue midline. Patient has equal grip strength bilaterally with 5/5 strength against resistance in all major muscle groups bilaterally. Sensation to light touch intact. Patient moves extremities without ataxia. Normal finger-nose-finger. Patient ambulatory with steady gait.  EYES:  eyes equal and reactive ENT/NECK:  Supple, no stridor  CARDIO:  regular rate and rhythm,  appears well-perfused  PULM:  No respiratory distress, CTAB GI/GU:  non-distended, soft MSK/SPINE:  No gross deformities, no edema, moves all extremities  SKIN:  no rash, atraumatic   *Additional and/or pertinent findings included in MDM below    ED Results / Procedures / Treatments   Labs (all labs ordered are listed, but only abnormal results are displayed) Labs Reviewed  BASIC METABOLIC PANEL  CBC  TROPONIN I (HIGH SENSITIVITY)  TROPONIN I (HIGH SENSITIVITY)    EKG None  Radiology CT Head Wo Contrast  Result Date:  09/27/2022 CLINICAL DATA:  Acute stroke suspected, neuro deficit EXAM: CT HEAD WITHOUT CONTRAST TECHNIQUE: Contiguous axial images were obtained from the base of the skull through the vertex without intravenous contrast. RADIATION DOSE REDUCTION: This exam was performed according to the departmental dose-optimization program which includes automated exposure control, adjustment of the mA and/or kV according to patient size and/or use of iterative reconstruction technique. COMPARISON:  None Available. FINDINGS: Brain: No evidence of acute infarction, hemorrhage, hydrocephalus, extra-axial collection or mass lesion/mass effect. Mild periventricular white matter hypodensity. Vascular: No hyperdense vessel or unexpected calcification. Skull: Normal. Negative for fracture or focal lesion. Sinuses/Orbits: No acute finding. Other: None. IMPRESSION: No acute intracranial pathology. Small-vessel white matter disease. Consider MRI to more sensitively assess for acute diffusion restricting infarction if clinically suspected. Electronically Signed   By: Jearld Lesch M.D.   On: 09/27/2022 14:32   DG Chest 2 View  Result Date: 09/27/2022 CLINICAL DATA:  Chest pain EXAM: CHEST - 2 VIEW COMPARISON:  None Available. FINDINGS: The lungs are clear and negative for focal airspace consolidation, pulmonary edema or suspicious pulmonary nodule. No pleural effusion or pneumothorax. Cardiac and mediastinal contours are within normal limits. No acute fracture or lytic or blastic osseous lesions. The visualized upper abdominal bowel gas pattern is unremarkable. IMPRESSION: No active cardiopulmonary disease. Electronically Signed   By: Malachy Moan M.D.   On: 09/27/2022 11:25   DG Chest 2 View  Result Date: 09/26/2022 CLINICAL DATA:  Shortness of breath with activity over the last 3 months. Symptoms are progressing. EXAM: CHEST - 2 VIEW COMPARISON:  None Available. FINDINGS: The heart size and mediastinal contours are within  normal limits. Both lungs are clear. The visualized skeletal structures are unremarkable. IMPRESSION: Negative two view chest x-ray Electronically Signed   By: Marin Roberts M.D.   On: 09/26/2022 10:30    Procedures Procedures  {Document cardiac monitor, telemetry assessment procedure when appropriate:1}  Medications Ordered in ED Medications - No data to display  ED Course/ Medical Decision Making/ A&P   {   Click here for ABCD2, HEART and other calculatorsREFRESH Note before signing :1}                          Medical Decision Making Amount and/or Complexity of Data Reviewed Labs: ordered. Radiology: ordered.   Initial Impression and Ddx 72 year old well-appearing female presenting for chest pain and syncopal event.  Was unremarkable.  DDx includes stroke, intracranial mass, ACS, PE, aortic dissection, electrolyte derangement,  psychogenic syncope. Patient PMH that increases complexity of ED encounter: History of atypical chest pain  Interpretation of Diagnostics - I independent reviewed and interpreted the labs as followed: No acute derangement  - I independently visualized the following imaging with scope of interpretation limited to determining acute life threatening conditions related to emergency care: CT head, which revealed small vessel white matter disease, chest x-ray which revealed no acute  cardiopulmonary disease  -I personally reviewed and interpreted EKG which revealed normal sinus rhythm  Patient Reassessment and Ultimate Disposition/Management Patient overall is clinically well.  ACS evaluation was overall reassuring.  Considered PE but unlikely given negative D-dimer yesterday and patient is not been tachycardic or hypoxic.  Patient management required discussion with the following services or consulting groups:  None  Complexity of Problems Addressed {BEROCOPA:26833}  Additional Data Reviewed and Analyzed Further history obtained  from: {BERODATA:26834}  Patient Encounter Risk Assessment {BERORISK:26838}   {Document critical care time when appropriate:1} {Document review of labs and clinical decision tools ie heart score, Chads2Vasc2 etc:1}  {Document your independent review of radiology images, and any outside records:1} {Document your discussion with family members, caretakers, and with consultants:1} {Document social determinants of health affecting pt's care:1} {Document your decision making why or why not admission, treatments were needed:1} Final Clinical Impression(s) / ED Diagnoses Final diagnoses:  None    Rx / DC Orders ED Discharge Orders     None

## 2022-09-27 NOTE — ED Notes (Signed)
Patient able to use bedside commode with no assistance.

## 2022-09-27 NOTE — ED Notes (Signed)
The patient is coming from radiology 

## 2022-09-27 NOTE — Telephone Encounter (Signed)
Attempted to reach patient's son, but no answer. His name is not on DPR, so didn't leave message. Called patient's cell and was able to speak directly with patient. She is currently in [facility] ED. States she "just passed out in ultrasound." She states the respiratory therapist at [facility] informed her that she should ask for a bubble ECHO because that's how they had found her (the RT's) PFO but only after she'd had a stroke. Explained to patient that a murmur doesn't indicate presence of PFO, but she certainly could ask the ED physician to order it. I did encourage her to ask for the PFT's to be scheduled or done before leaving since it relates to her situation of SOB and is done at hospital anyway. No further questions.

## 2022-09-27 NOTE — Telephone Encounter (Signed)
Elizabeth Eagle, PA calling to speak to Dr. Elease Hashimoto or DOD. Call disconnected before I could get someone on the line.

## 2022-09-27 NOTE — ED Triage Notes (Addendum)
Pt came to ED for chest pain for months but has gotten worse in the past couple of weeks. Pt has SHOB with exertion. Pt has cardiologist for [facility]. Axox4. Pt denies palpations at this time.  Pt c/o left lower leg swelling.

## 2022-10-01 NOTE — Progress Notes (Unsigned)
Cardiology Office Note:    Date:  10/02/2022   ID:  Elizabeth Miranda, DOB [birth year], MRN [id number]  PCP:  Elizabeth Stabile, MD  [facility] Providers Cardiologist:  Elizabeth Miss, MD     Referring MD: Elizabeth Stabile, MD   Chief Complaint:  Follow-up, Shortness of Breath, and Leg Swelling     History of Present Illness:   Elizabeth Miranda is a 72 y.o. female with  history of hypothyroidism, palpitations, exercise intolerance and DOE. History of chest pain Coronary CTA 08/2022 calcium score 0 normal coronary arteries.  Zio rare atrial tachycardia 06/2022, echo 05/2022 normal LVEF, grade 1DD trivial MR.  She called in after ED 09/26/22 visit with SOB, dizzy and weakness  09/26/22. She was requesting echo with bubble study to r/o PFO because someone told her to. Troponin neg BNP normal. Back in ED 09/27/22 with chest pain.Claims to have had syncope during CXR.  So CT, MRI, CXR, labs stable except for LEFT OCCIPITAL HEMORRHAGIC LESION (cavernous malformation, dystrophic mineralization, prior intracerebral hemorrhage) - likely incidental finding; monitor; may consider repeat MRI brain w/wo in 6 months  Patient here with her daughter and daughter in law. Just came from neurology and EEG, and labs ordered. She complains of chest pressure, DOE, weakness- pronounced. BP dropping so she stopped metoprolol. About a week ago when walking up steps she had cold sweats and had to sit down. Orthostatics done today at neuro and 155/79 lying standing 174/72.      Past Medical History:  Diagnosis Date   Hypothyroid    Palpitations    Current Medications: Current Meds  Medication Sig   B Complex-C (B-COMPLEX WITH VITAMIN C) tablet Take 1 tablet by mouth daily.   cholecalciferol (VITAMIN D3) 25 MCG (1000 UNIT) tablet Take 5,000 Units by mouth daily.   ESTRADIOL PO Take 1.5 mg by mouth daily. Compound into a capsule. Per patient taking 0.5 mg   Estradiol-Estriol-Progesterone (BIEST/PROGESTERONE TD)  Place onto the skin. 80/20 compound. Per patient taking 1.5 mg daily   levothyroxine (SYNTHROID, LEVOTHROID) 100 MCG tablet Take 1 tablet by mouth daily.   liothyronine (CYTOMEL) 25 MCG tablet Take 25 mcg by mouth daily.   losartan (COZAAR) 25 MG tablet Take 1 tablet (25 mg total) by mouth daily.   Melatonin 5 MG/15ML LIQD Take 1 mg by mouth at bedtime as needed. spray   metFORMIN (GLUCOPHAGE-XR) 500 MG 24 hr tablet Take 500 mg by mouth 2 (two) times daily. Per patient taking for PCOS   Multiple Vitamin (MULITIVITAMIN WITH MINERALS) TABS Take 1 tablet by mouth daily.    Allergies:   Codeine and Sulfa antibiotics   Social History   Tobacco Use   Smoking status: Never   Smokeless tobacco: Never  Substance Use Topics   Alcohol use: No   Drug use: No    Family Hx: The patient's family history is not on file.  ROS     Physical Exam:    VS:  BP (!) 148/72 (BP Location: Left Arm, Patient Position: Sitting, Cuff Size: Normal)   Pulse 86   Ht 5\' 3"  (1.6 m)   Wt 153 lb (69.4 kg)   BMI 27.10 kg/m     Wt Readings from Last 3 Encounters:  10/02/22 153 lb (69.4 kg)  10/02/22 152 lb (68.9 kg)  09/27/22 152 lb (68.9 kg)    Physical Exam  GEN: Well nourished, well developed, in no acute distress  HEENT: normal  Neck: no  JVD, carotid bruits, or masses Cardiac:RRR; no murmurs, rubs, or gallops  Respiratory:  clear to auscultation bilaterally, normal work of breathing GI: soft, nontender, nondistended, + BS Ext: chronic Left LE edema, otherwise without cyanosis, clubbing, or edema, Good distal pulses bilaterally Neuro:  Alert and Oriented x 3,  Psych: euthymic mood, full affect        EKGs/Labs/Other Test Reviewed:    EKG:  EKG is not ordered today.     Recent Labs: 05/27/2022: ALT 21; TSH 1.600 09/26/2022: B Natriuretic Peptide 7.8 09/27/2022: BUN 14; Creatinine, Ser 0.78; Hemoglobin 14.0; Platelets 347; Potassium 4.0; Sodium 135   Recent Lipid Panel Recent Labs     05/27/22 1611  CHOL 214*  TRIG 186*  HDL 52  LDLCALC 129*     Prior CV Studies:   Cor CTA 08/2022 IMPRESSION: 1.  Calcium score 0   2.  Normal right dominant coronary arteries   3.  Normal ascending thoracic aorta 3.1 cm   Elizabeth Miranda   Electronically Signed: By: Elizabeth Miranda M.D. On: 09/02/2022 14:07   2 week Zio 06/2022   Predominate rhythm is sinus rhythm including NSR, sinus bradycardia and sinus tachycardia   Rare and very brief episodes of atrial tachycardia .   Rare Premature ventricular contractions   The patient triggers were associated with sinus rhythm .   No evidence of atrial fib   No pauses     Patch Wear Time:  13 days and 23 hours (2023-12-11T16:06:04-499 to 2023-12-25T16:06:08-498)   Patient had a min HR of 54 bpm, max HR of 143 bpm, and avg HR of 79 bpm. Predominant underlying rhythm was Sinus Rhythm. 4 Supraventricular Tachycardia runs occurred, the run with the fastest interval lasting 6 beats with a max rate of 143 bpm, the  longest lasting 25.9 secs with an avg rate of 98 bpm. Ectopic Atrial Rhythm was present. Ectopic Atrial Rhythm was detected within +/- 45 seconds of symptomatic patient event(s). Isolated SVEs were rare (<1.0%), SVE Couplets were rare (<1.0%), and no SVE  Triplets were present. Isolated VEs were rare (<1.0%), VE Couplets were rare (<1.0%), and no VE Triplets were present. Ventricular Trigeminy was present. Difficulty discerning atrial activity during periods of high heart rate, making definitive  diagnosis between Sinus Tachycardia and Supraventricular Tachycardia difficult to ascertain.   Echo 05/2022 MPRESSIONS     1. Left ventricular ejection fraction, by estimation, is 60 to 65%. The  left ventricle has normal function. The left ventricle has no regional  wall motion abnormalities. Left ventricular diastolic parameters are  consistent with Grade I diastolic  dysfunction (impaired relaxation). The average left ventricular  global  longitudinal strain is -20.6 %. The global longitudinal strain is normal.   2. Right ventricular systolic function is normal. The right ventricular  size is normal.   3. The mitral valve is normal in structure. Trivial mitral valve  regurgitation. No evidence of mitral stenosis.   4. The aortic valve is normal in structure. Aortic valve regurgitation is  not visualized. No aortic stenosis is present.   5. The inferior vena cava is normal in size with greater than 50%  respiratory variability, suggesting right atrial pressure of 3 mmHg.    Risk Assessment/Calculations/Metrics:         HYPERTENSION CONTROL Vitals:   10/02/22 1149  BP: (!) 148/72    The patient's blood pressure is elevated above target today.  In order to address the patient's elevated BP: Blood pressure will be  monitored at home to determine if medication changes need to be made.       ASSESSMENT & PLAN:   No problem-specific Assessment & Plan notes found for this encounter.   History of chest pain/DOE-normal coronary CTA 08/2022. Continues to have chest pain and DOE and would like a stress test and requesting an echo with a bubble study even if they have to pay out of pocket. Reviewed CTA and echo in detail. If these tests are normal suggest she continue work up of symptoms with PCP and neurology.   Palpitations monitor 07/2022 brief atrial tachycardia, PVC's -didn't tolerate metoprolol so stopped. Could consider decreasing cozaar and taking metoprolol but will have her f/u with Dr. Elease Hashimoto  Left lower ext edema chronic and unchanged. D dimer normal in hospital.            Shared Decision Making/Informed Consent The risks [chest pain, shortness of breath, cardiac arrhythmias, dizziness, blood pressure fluctuations, myocardial infarction, stroke/transient ischemic attack, nausea, vomiting, allergic reaction, radiation exposure, metallic taste sensation and life-threatening complications (estimated to be  1 in 10,000)], benefits (risk stratification, diagnosing coronary artery disease, treatment guidance) and alternatives of a nuclear stress test were discussed in detail with Elizabeth Miranda and she agrees to proceed.   Dispo:  No follow-ups on file.   Medication Adjustments/Labs and Tests Ordered: Current medicines are reviewed at length with the patient today.  Concerns regarding medicines are outlined above.  Tests Ordered: Orders Placed This Encounter  Procedures   Cardiac Stress Test: Informed Consent Details: Physician/Practitioner Attestation; Transcribe to consent form and obtain patient signature   MYOCARDIAL PERFUSION IMAGING   ECHOCARDIOGRAM COMPLETE BUBBLE STUDY   Medication Changes: No orders of the defined types were placed in this encounter.  Signed, Jacolyn Reedy, PA-C  10/02/2022 12:22 PM    [facility] [address] [place], [place], [place]  [address] Phone: [phone]; Fax: [phone]

## 2022-10-02 ENCOUNTER — Ambulatory Visit: Payer: Medicare Other | Admitting: Diagnostic Neuroimaging

## 2022-10-02 ENCOUNTER — Encounter: Payer: Self-pay | Admitting: Diagnostic Neuroimaging

## 2022-10-02 ENCOUNTER — Encounter: Payer: Self-pay | Admitting: Physician Assistant

## 2022-10-02 ENCOUNTER — Ambulatory Visit: Payer: Medicare Other | Attending: Physician Assistant | Admitting: Physician Assistant

## 2022-10-02 ENCOUNTER — Telehealth (HOSPITAL_COMMUNITY): Payer: Self-pay | Admitting: *Deleted

## 2022-10-02 VITALS — BP 148/72 | HR 86 | Ht 63.0 in | Wt 153.0 lb

## 2022-10-02 VITALS — BP 147/74 | HR 69 | Ht 63.0 in | Wt 152.0 lb

## 2022-10-02 DIAGNOSIS — Z79899 Other long term (current) drug therapy: Secondary | ICD-10-CM | POA: Diagnosis not present

## 2022-10-02 DIAGNOSIS — R0609 Other forms of dyspnea: Secondary | ICD-10-CM

## 2022-10-02 DIAGNOSIS — Z0189 Encounter for other specified special examinations: Secondary | ICD-10-CM

## 2022-10-02 DIAGNOSIS — R002 Palpitations: Secondary | ICD-10-CM | POA: Diagnosis not present

## 2022-10-02 DIAGNOSIS — R799 Abnormal finding of blood chemistry, unspecified: Secondary | ICD-10-CM | POA: Diagnosis not present

## 2022-10-02 DIAGNOSIS — I491 Atrial premature depolarization: Secondary | ICD-10-CM

## 2022-10-02 DIAGNOSIS — R079 Chest pain, unspecified: Secondary | ICD-10-CM | POA: Diagnosis not present

## 2022-10-02 DIAGNOSIS — R531 Weakness: Secondary | ICD-10-CM | POA: Diagnosis not present

## 2022-10-02 DIAGNOSIS — Z5181 Encounter for therapeutic drug level monitoring: Secondary | ICD-10-CM | POA: Diagnosis not present

## 2022-10-02 NOTE — Progress Notes (Signed)
[facility]  PATIENT: Elizabeth Miranda DOB: 25-Mar-1951  REFERRING CLINICIAN: Renne Crigler, PA-C HISTORY FROM: patient  REASON FOR VISIT: new consult   HISTORICAL  CHIEF COMPLAINT:  Chief Complaint  Patient presents with   New Patient (Initial Visit)    Patient in room #6 with her daughters. Patient states she here today for a MRI follow up. Patient states she feels out of breath and feels weak.    HISTORY OF PRESENT ILLNESS:   72 year old female here for evaluation of syncope and abnormal MRI brain.  September 2023 patient started having episodes of intermittent dyspnea on exertion, lightheadedness and dizziness.  Sometimes this was associated with palpitations when laying flat on the bed.  She was having some night sweats.  Symptoms progressively worsened over time.  She followed up with cardiology and PCP. 09/26/22 she had more severe symptoms and went to emergency room for evaluation.  She was having exertional lightheadedness, diaphoresis, nausea.  She was evaluated and discharged.  She returned the next day for recurrent symptoms.  She was taken for x-ray and had a brief syncopal event.  She had CT of the head which showed no abnormalities.  MRI of the brain showed no acute findings but did show a small chronic left occipital focus of hemorrhage.  Since that time patient continues to have generalized malaise, weakness, fatigue, intolerance to laying totally flat.  Also having some left-sided headache and left jaw pain.  No recent accidents injuries or traumas.  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   REVIEW OF SYSTEMS: Full 14 system review of systems performed and negative with exception of: as per HPI.  ALLERGIES: Allergies  Allergen Reactions   Codeine     Hives    Sulfa Antibiotics Nausea And Vomiting    HOME MEDICATIONS: Outpatient Medications Prior to Visit  Medication Sig Dispense Refill   B  Complex-C (B-COMPLEX WITH VITAMIN C) tablet Take 1 tablet by mouth daily.     cholecalciferol (VITAMIN D3) 25 MCG (1000 UNIT) tablet Take 5,000 Units by mouth daily.     ESTRADIOL PO Take 1.5 mg by mouth daily. Compound into a capsule. Per patient taking 0.5 mg     Estradiol-Estriol-Progesterone (BIEST/PROGESTERONE TD) Place onto the skin. 80/20 compound. Per patient taking 1.5 mg daily     levothyroxine (SYNTHROID, LEVOTHROID) 100 MCG tablet Take 1 tablet by mouth daily.  4   liothyronine (CYTOMEL) 25 MCG tablet Take 25 mcg by mouth daily.     losartan (COZAAR) 25 MG tablet Take 1 tablet (25 mg total) by mouth daily. 30 tablet 0   Melatonin 5 MG/15ML LIQD Take 1 mg by mouth at bedtime as needed. spray     metFORMIN (GLUCOPHAGE-XR) 500 MG 24 hr tablet Take 500 mg by mouth 2 (two) times daily. Per patient taking for PCOS     Multiple Vitamin (MULITIVITAMIN WITH MINERALS) TABS Take 1 tablet by mouth daily.     metoprolol tartrate (LOPRESSOR) 25 MG tablet Take 1 tablet (25 mg total) by mouth 2 (two) times daily. (Patient not taking: Reported on 09/26/2022) 180 tablet 3   No facility-administered medications prior to visit.    PAST MEDICAL HISTORY: Past Medical History:  Diagnosis Date   Hypothyroid    Palpitations     PAST SURGICAL HISTORY: Past Surgical History:  Procedure Laterality Date   APPENDECTOMY     complete hysterectomy'     PARTIAL HYSTERECTOMY  FAMILY HISTORY: History reviewed. No pertinent family history.  SOCIAL HISTORY: Social History   Socioeconomic History   Marital status: Married    Spouse name: Not on file   Number of children: Not on file   Years of education: Not on file   Highest education level: Not on file  Occupational History   Not on file  Tobacco Use   Smoking status: Never   Smokeless tobacco: Never  Substance and Sexual Activity   Alcohol use: No   Drug use: No   Sexual activity: Not on file  Other Topics Concern   Not on file   Social History Narrative   Not on file   Social Determinants of Health   Financial Resource Strain: Not on file  Food Insecurity: Not on file  Transportation Needs: Not on file  Physical Activity: Not on file  Stress: Not on file  Social Connections: Not on file  Intimate Partner Violence: Not on file     PHYSICAL EXAM   GENERAL EXAM/CONSTITUTIONAL: Vitals:  Vitals:   10/02/22 0825  BP: (!) 147/74  Pulse: 69  Weight: 152 lb (68.9 kg)  Height:  (1.6 m)   Body mass index is 26.93 kg/m. Wt Readings from Last 3 Encounters:  10/02/22 153 lb (69.4 kg)  10/02/22 152 lb (68.9 kg)  09/27/22 152 lb (68.9 kg)  Orthostatic VS for the past 24 hrs (Last 3 readings):  BP- Lying Pulse- Lying BP- Standing at 3 minutes Pulse- Standing at 3 minutes  10/02/22 1537 155/79 70 174/72 80   Patient is in no distress; well developed, nourished and groomed; neck is supple  CARDIOVASCULAR: Examination of carotid arteries is normal; no carotid bruits Regular rate and rhythm, no murmurs Examination of peripheral vascular system by observation and palpation is normal  EYES: Ophthalmoscopic exam of optic discs and posterior segments is normal; no papilledema or hemorrhages No results found.  MUSCULOSKELETAL: Gait, strength, tone, movements noted in Neurologic exam below  NEUROLOGIC: MENTAL STATUS:      No data to display         awake, alert, oriented to person, place and time recent and remote memory intact normal attention and concentration language fluent, comprehension intact, naming intact fund of knowledge appropriate  CRANIAL NERVE:  2nd - no papilledema on fundoscopic exam 2nd, 3rd, 4th, 6th - pupils equal and reactive to light, visual fields full to confrontation, extraocular muscles intact, no nystagmus 5th - facial sensation symmetric 7th - facial strength symmetric 8th - hearing intact 9th - palate elevates symmetrically, uvula midline 11th - shoulder shrug  symmetric 12th - tongue protrusion midline  MOTOR:  normal bulk and tone, full strength in the BUE, BLE  SENSORY:  normal and symmetric to light touch, temperature, vibration  COORDINATION:  finger-nose-finger, fine finger movements normal  REFLEXES:  deep tendon reflexes TRACE and symmetric  GAIT/STATION:  narrow based gait     DIAGNOSTIC DATA (LABS, IMAGING, TESTING) - I reviewed patient records, labs, notes, testing and imaging myself where available.  Lab Results  Component Value Date   WBC 5.8 09/27/2022   HGB 14.0 09/27/2022   HCT 42.6 09/27/2022   MCV 89.9 09/27/2022   PLT 347 09/27/2022      Component Value Date/Time   NA 135 09/27/2022 1033   NA 140 08/27/2022 1508   K 4.0 09/27/2022 1033   CL 101 09/27/2022 1033   CO2 23 09/27/2022 1033   GLUCOSE 89 09/27/2022 1033   BUN  14 09/27/2022 1033   BUN 19 08/27/2022 1508   CREATININE 0.78 09/27/2022 1033   CALCIUM 9.2 09/27/2022 1033   PROT 7.8 05/27/2022 1611   ALBUMIN 4.8 05/27/2022 1611   AST 27 05/27/2022 1611   ALT 21 05/27/2022 1611   ALKPHOS 70 05/27/2022 1611   BILITOT 0.2 05/27/2022 1611   GFRNONAA >60 09/27/2022 1033   GFRAA >60 07/15/2019 0403   Lab Results  Component Value Date   CHOL 214 (H) 05/27/2022   HDL 52 05/27/2022   LDLCALC 129 (H) 05/27/2022   TRIG 186 (H) 05/27/2022   CHOLHDL 4.1 05/27/2022   No results found for: "HGBA1C" No results found for: "VITAMINB12" Lab Results  Component Value Date   TSH 1.600 05/27/2022    09/27/22 MRI brain wo [I reviewed images myself and agree with interpretation. -VRP]  1. No evidence of acute intracranial abnormality. 2. Prior left occipital hemorrhage.    ASSESSMENT AND PLAN  72 y.o. year old female here with:   Dx:  1. Weakness      PLAN:  DYSPNEA ON EXERTION, LIGHTHEADEDNESS, NIGHT SWEATS, DIZZINESS, BP FLUCTUTATIONS, SYNCOPE (09/27/22); other symptoms since ~Sept 2023  - check labs and EEG (seizure less likely)  -  follow up with PCP, cardiology; consider pulmonology  - According to [facility] law, you can not drive unless you are seizure / syncope free for at least 6 months and under physician's care (although patient had not eaten for a while that day)  - Please maintain precautions. Do not participate in activities where a loss of awareness could harm you or someone else. No swimming alone, no tub bathing, no hot tubs, no driving, no operating motorized vehicles (cars, ATVs, motocycles, etc), lawnmowers, power tools or firearms. No standing at heights, such as rooftops, ladders or stairs. Avoid hot objects such as stoves, heaters, open fires. Wear a helmet when riding a bicycle, scooter, skateboard, etc. and avoid areas of traffic. Set your water heater to 120 degrees or less.    LEFT OCCIPITAL HEMORRHAGIC LESION (cavernous malformation, dystrophic mineralization, prior intracerebral hemorrhage) - likely incidental finding; monitor; may consider repeat MRI brain w/wo in 6 months   Orders Placed This Encounter  Procedures   CK   Aldolase   Vitamin B12   Hemoglobin A1c   EEG adult   Return in about 6 months (around 04/03/2023).  I spent 60 minutes of face-to-face and non-face-to-face time with patient.  This included previsit chart review, lab review, study review, order entry, electronic health record documentation, patient education.    Suanne Marker, MD 10/02/2022, 3:17 PM Certified in Neurology, Neurophysiology and Neuroimaging  [facility] [address] [place], [place] [address] [phone]

## 2022-10-02 NOTE — Patient Instructions (Signed)
   DYSPNEA ON EXERTION, LIGHTHEADEDNESS, DIZZINESS, BP fluctuations, syncope x 1 (09/27/22)  - check labs and EEG  - follow up with PCP, cardiology; consider pulmonology  - According to [facility] law, you can not drive unless you are seizure / syncope free for at least 6 months and under physician's care (although patient had not eaten for a while that day)  - Please maintain precautions. Do not participate in activities where a loss of awareness could harm you or someone else. No swimming alone, no tub bathing, no hot tubs, no driving, no operating motorized vehicles (cars, ATVs, motocycles, etc), lawnmowers, power tools or firearms. No standing at heights, such as rooftops, ladders or stairs. Avoid hot objects such as stoves, heaters, open fires. Wear a helmet when riding a bicycle, scooter, skateboard, etc. and avoid areas of traffic. Set your water heater to 120 degrees or less.    LEFT OCCIPITAL HEMORRHAGIC LESION (cavernous malformation, dystrophic mineralization, prior intracerebral hemorrhage) - likely incidental finding; monitor; may consider repeat MRI brain w/wo in 6 months

## 2022-10-02 NOTE — Telephone Encounter (Signed)
Per DPR spoke with pt's husband Brett Canales and gave instructions for MPI study scheduled on 10/10/22.

## 2022-10-02 NOTE — Patient Instructions (Signed)
Medication Instructions:  Your physician recommends that you continue on your current medications as directed. Please refer to the Current Medication list given to you today.   *If you need a refill on your cardiac medications before your next appointment, please call your pharmacy*   Lab Work: None ordered   If you have labs (blood work) drawn today and your tests are completely normal, you will receive your results only by: MyChart Message (if you have MyChart) OR A paper copy in the mail If you have any lab test that is abnormal or we need to change your treatment, we will call you to review the results.   Testing/Procedures: Your physician has requested that you have an echocardiogram BUBBLE study. Echocardiography is a painless test that uses sound waves to create images of your heart. It provides your doctor with information about the size and shape of your heart and how well your heart's chambers and valves are working. This procedure takes approximately one hour. There are no restrictions for this procedure. Please do NOT wear cologne, perfume, aftershave, or lotions (deodorant is allowed). Please arrive 15 minutes prior to your appointment time.  Your physician has requested that you have a lexiscan myoview. For further information please visit [email or web address] Please follow instruction sheet, as given.     Follow-Up: Follow up as scheduled   Other Instructions

## 2022-10-03 ENCOUNTER — Ambulatory Visit: Payer: Medicare Other | Admitting: Diagnostic Neuroimaging

## 2022-10-03 DIAGNOSIS — R55 Syncope and collapse: Secondary | ICD-10-CM

## 2022-10-03 DIAGNOSIS — R531 Weakness: Secondary | ICD-10-CM

## 2022-10-03 LAB — ALDOLASE

## 2022-10-03 LAB — HEMOGLOBIN A1C: Est. average glucose Bld gHb Est-mCnc: 117 mg/dL

## 2022-10-04 LAB — HEMOGLOBIN A1C: Hgb A1c MFr Bld: 5.7 % — ABNORMAL HIGH (ref 4.8–5.6)

## 2022-10-04 LAB — VITAMIN B12: Vitamin B-12: 1149 pg/mL (ref 232–1245)

## 2022-10-04 LAB — CK: Total CK: 31 U/L — ABNORMAL LOW (ref 32–182)

## 2022-10-07 ENCOUNTER — Other Ambulatory Visit: Payer: Self-pay

## 2022-10-07 ENCOUNTER — Encounter: Payer: Self-pay | Admitting: Diagnostic Neuroimaging

## 2022-10-07 ENCOUNTER — Emergency Department (HOSPITAL_COMMUNITY): Payer: Medicare Other

## 2022-10-07 ENCOUNTER — Observation Stay (HOSPITAL_COMMUNITY)
Admission: EM | Admit: 2022-10-07 | Discharge: 2022-10-08 | Disposition: A | Discharge status: Home Health | Payer: Medicare Other | Attending: Family Medicine | Admitting: Family Medicine

## 2022-10-07 ENCOUNTER — Telehealth: Payer: Self-pay | Admitting: Cardiovascular Disease

## 2022-10-07 ENCOUNTER — Encounter (HOSPITAL_COMMUNITY): Payer: Self-pay | Admitting: Emergency Medicine

## 2022-10-07 DIAGNOSIS — E119 Type 2 diabetes mellitus without complications: Secondary | ICD-10-CM | POA: Diagnosis not present

## 2022-10-07 DIAGNOSIS — R29898 Other symptoms and signs involving the musculoskeletal system: Secondary | ICD-10-CM | POA: Diagnosis not present

## 2022-10-07 DIAGNOSIS — R519 Headache, unspecified: Secondary | ICD-10-CM | POA: Insufficient documentation

## 2022-10-07 DIAGNOSIS — I10 Essential (primary) hypertension: Secondary | ICD-10-CM

## 2022-10-07 DIAGNOSIS — R531 Weakness: Secondary | ICD-10-CM | POA: Diagnosis not present

## 2022-10-07 DIAGNOSIS — R29818 Other symptoms and signs involving the nervous system: Secondary | ICD-10-CM | POA: Diagnosis not present

## 2022-10-07 DIAGNOSIS — I6523 Occlusion and stenosis of bilateral carotid arteries: Secondary | ICD-10-CM | POA: Diagnosis not present

## 2022-10-07 DIAGNOSIS — E039 Hypothyroidism, unspecified: Secondary | ICD-10-CM

## 2022-10-07 DIAGNOSIS — R6889 Other general symptoms and signs: Secondary | ICD-10-CM | POA: Diagnosis not present

## 2022-10-07 DIAGNOSIS — R202 Paresthesia of skin: Secondary | ICD-10-CM | POA: Diagnosis not present

## 2022-10-07 DIAGNOSIS — G44219 Episodic tension-type headache, not intractable: Secondary | ICD-10-CM

## 2022-10-07 DIAGNOSIS — R4781 Slurred speech: Secondary | ICD-10-CM | POA: Diagnosis not present

## 2022-10-07 DIAGNOSIS — I6501 Occlusion and stenosis of right vertebral artery: Secondary | ICD-10-CM | POA: Insufficient documentation

## 2022-10-07 DIAGNOSIS — R7303 Prediabetes: Secondary | ICD-10-CM | POA: Diagnosis not present

## 2022-10-07 DIAGNOSIS — I6502 Occlusion and stenosis of left vertebral artery: Secondary | ICD-10-CM | POA: Diagnosis not present

## 2022-10-07 DIAGNOSIS — Z79899 Other long term (current) drug therapy: Secondary | ICD-10-CM | POA: Diagnosis not present

## 2022-10-07 DIAGNOSIS — Z743 Need for continuous supervision: Secondary | ICD-10-CM | POA: Diagnosis not present

## 2022-10-07 DIAGNOSIS — Z7984 Long term (current) use of oral hypoglycemic drugs: Secondary | ICD-10-CM | POA: Insufficient documentation

## 2022-10-07 DIAGNOSIS — M6281 Muscle weakness (generalized): Secondary | ICD-10-CM | POA: Diagnosis not present

## 2022-10-07 LAB — CBC
HCT: 39 % (ref 36.0–46.0)
Hemoglobin: 13 g/dL (ref 12.0–15.0)
MCH: 30.1 pg (ref 26.0–34.0)
MCHC: 33.3 g/dL (ref 30.0–36.0)
MCV: 90.3 fL (ref 80.0–100.0)
Platelets: 323 10*3/uL (ref 150–400)
RBC: 4.32 MIL/uL (ref 3.87–5.11)
RDW: 12 % (ref 11.5–15.5)
WBC: 6 10*3/uL (ref 4.0–10.5)
nRBC: 0 % (ref 0.0–0.2)

## 2022-10-07 LAB — COMPREHENSIVE METABOLIC PANEL
ALT: 16 U/L (ref 0–44)
AST: 21 U/L (ref 15–41)
Albumin: 4 g/dL (ref 3.5–5.0)
Alkaline Phosphatase: 60 U/L (ref 38–126)
Anion gap: 10 (ref 5–15)
BUN: 17 mg/dL (ref 8–23)
CO2: 24 mmol/L (ref 22–32)
Calcium: 9.2 mg/dL (ref 8.9–10.3)
Chloride: 104 mmol/L (ref 98–111)
Creatinine, Ser: 0.64 mg/dL (ref 0.44–1.00)
GFR, Estimated: >60 mL/min (ref >60)
Glucose, Bld: 114 mg/dL — ABNORMAL HIGH (ref 70–99)
Potassium: 3.7 mmol/L (ref 3.5–5.1)
Sodium: 138 mmol/L (ref 135–145)
Total Bilirubin: 0.5 mg/dL (ref 0.3–1.2)
Total Protein: 7.5 g/dL (ref 6.5–8.1)

## 2022-10-07 LAB — RAPID URINE DRUG SCREEN, HOSP PERFORMED
Amphetamines: NOT DETECTED
Barbiturates: NOT DETECTED
Benzodiazepines: NOT DETECTED
Cocaine: NOT DETECTED
Opiates: NOT DETECTED
Tetrahydrocannabinol: NOT DETECTED

## 2022-10-07 LAB — DIFFERENTIAL
Abs Immature Granulocytes: 0.02 10*3/uL (ref 0.00–0.07)
Basophils Absolute: 0 10*3/uL (ref 0.0–0.1)
Basophils Relative: 0 %
Eosinophils Absolute: 0.1 10*3/uL (ref 0.0–0.5)
Eosinophils Relative: 2 %
Immature Granulocytes: 0 %
Lymphocytes Relative: 37 %
Lymphs Abs: 2.2 10*3/uL (ref 0.7–4.0)
Monocytes Absolute: 0.8 10*3/uL (ref 0.1–1.0)
Monocytes Relative: 13 %
Neutro Abs: 2.9 10*3/uL (ref 1.7–7.7)
Neutrophils Relative %: 48 %

## 2022-10-07 LAB — URINALYSIS, ROUTINE W REFLEX MICROSCOPIC
Bilirubin Urine: NEGATIVE
Glucose, UA: NEGATIVE mg/dL
Hgb urine dipstick: NEGATIVE
Ketones, ur: NEGATIVE mg/dL
Leukocytes,Ua: NEGATIVE
Nitrite: NEGATIVE
Protein, ur: NEGATIVE mg/dL
Specific Gravity, Urine: 1.004 — ABNORMAL LOW (ref 1.005–1.030)
pH: 6 (ref 5.0–8.0)

## 2022-10-07 LAB — CBG MONITORING, ED: Glucose-Capillary: 108 mg/dL — ABNORMAL HIGH (ref 70–99)

## 2022-10-07 LAB — PROTIME-INR
INR: 0.9 (ref 0.8–1.2)
Prothrombin Time: 12.4 seconds (ref 11.4–15.2)

## 2022-10-07 LAB — APTT: aPTT: 24 seconds (ref 24–36)

## 2022-10-07 LAB — ETHANOL: Alcohol, Ethyl (B): <10 mg/dL (ref <10)

## 2022-10-07 MED ORDER — BUTALBITAL-APAP-CAFFEINE 50-325-40 MG PO TABS
1.0000 | ORAL_TABLET | Freq: Once | ORAL | Status: AC
  Administered 2022-10-07: 1 via ORAL
  Filled 2022-10-07: qty 1

## 2022-10-07 MED ORDER — IOHEXOL 350 MG/ML SOLN
100.0000 mL | Freq: Once | INTRAVENOUS | Status: AC | PRN
  Administered 2022-10-07: 100 mL via INTRAVENOUS

## 2022-10-07 NOTE — Consult Note (Signed)
[facility] Tele[facility]urology Consult Services   Patient Name:   Elizabeth Miranda, Elizabeth Miranda Date of Birth:   12/23/1950 Identification Number:   MRN - [id number] Date of Service:   10/07/2022 16:01:36  Diagnosis:       R20.2 - Paresthesia of skin  Impression:      72 year old woman with cardiovascular risk factors to include hypertension, diabetes, and also incidentally found intracranial hemorrhage with left occipital ICH, with progressive shortness of breath and generalized fatigue now with right-sided weakness and numbness with neck pain and headache. Differential includes acute stroke versus complicated headache. She was not a thrombolytic candidate secondary to prior ICH history. CT scan of head did not show any acute findings. CT angiogram of the head and neck stat ordered for dissection and was negative.    Review of images show chronic ICH and no new hemorrhage therefore can start 81 mg ASA for now and plan for inhouse Neurology to evaluate tomorrow.    ** Per facility request will defer further work up, management, and referrals to inpatient service, inclusive of inpatient neurology consult.      I evaluated for this encounter only for stroke alert.    Recommend Neurology to follow. Please reconsult if/as needed if inhouse Neurology not available.    Thank you very much for allowing me to partake in this patient's care.    Please note that portions of the note were completed using Dragon voice recognition software          Our recommendations are outlined below.  Recommendations:        Stroke/Telemetry Floor       Neuro Checks       Bedside Swallow Eval       DVT Prophylaxis       IV Fluids, Normal Saline       Head of Bed 30 Degrees       Euglycemia and Avoid Hyperthermia (PRN Acetaminophen)       Antihypertensives PRN if Blood pressure is greater than 220/120 or there is a concern for End organ damage/contraindications for permissive HTN. If blood  pressure is greater than 220/120 give labetalol PO or IV or Vasotec IV with a goal of 15% reduction in BP during the first 24 hours.  Sign Out:       Discussed with Emergency Department Provider    ------------------------------------------------------------------------------  Advanced Imaging: CTA Head and Neck Completed.  CTP Completed.  LVO:No  Patient in not a candidate for NIR   Metrics: Last Known Well: 10/07/2022 12:00:00 [facility] Notification Time: 10/07/2022 16:01:36 Arrival Time: 10/07/2022 15:53:00 Stamp Time: 10/07/2022 16:01:36 Initial Response Time: 10/07/2022 16:05:02 Symptoms: R weakness, headache, neck pain. Initial patient interaction: 10/07/2022 16:12:03 NIHSS Assessment Completed: 10/07/2022 16:19:03 Patient is not a candidate for Thrombolytic. Thrombolytic Medical Decision: 10/07/2022 16:19:05 Patient was not deemed candidate for Thrombolytic because of following reasons: Current or Previous ICH.  CT head showed no acute hemorrhage or acute core infarct.  Primary Provider Notified of Diagnostic Impression and Management Plan on: 10/07/2022 16:41:15    ------------------------------------------------------------------------------  History of Present Illness: Patient is a 72 year old Female.  Patient was brought by EMS for symptoms of R weakness, headache, neck pain. 72 year old woman with a history significant for hypothyroidism, diabetes, hypertension and no significant prior history of headaches and chronic ICH with occipital hemorrhage incidentally found on MRI imaging 10 days ago who presented acutely to the emergency room with acute right-sided weakness along with numbness and headache and neck  pain. Patient noted that she went to lay down to take a nap at approximately 1100. At 1130 she had significant pain that shot up her neck and back and into her head. It was bad enough that it woke her up. She then subsequently developed weakness on  the right with arm and leg along with numbness of the face and right side. She became concerned and called her daughter and she was brought here for further evaluation where she was evaluated as a stroke alert. She was seen previously by Dr. Marjory Lies with neurology 4/17 for MRI follow-up. Patient noted shortness of breath and generalized weakness of which is continued today.  Review of the chart shows that patient evidently started having intermittent episodes of dyspnea on exertion along with lightheadedness and dizziness back in September 2023 that has progressively worsened over time. She was seen in the ER 4/12 and at that time had an MRI with the aforementioned chronic left occipital hemorrhage. Ultimately she was in the process of being worked up for possible seizures and was to follow-up with pulmonology as well. She was off antiplatelet and anticoagulants at this time.      Past Medical History:      Hypertension      Diabetes Mellitus  Medications:  No Anticoagulant use  No Antiplatelet use Reviewed EMR for current medications  Allergies:  Reviewed Description: Codeine, sulfa  Social History: Smoking: No Alcohol Use: No  Family History:  There is no family history of premature cerebrovascular disease pertinent to this consultation  ROS : 14 Points Review of Systems was performed and was negative except mentioned in HPI.  Past Surgical History: There Is No Surgical History Contributory To Today's Visit     Examination: BP(160/76), Pulse(84), 1A: Level of Consciousness - Alert; keenly responsive + 0 1B: Ask Month and Age - Both Questions Right + 0 1C: Blink Eyes & Squeeze Hands - Performs Both Tasks + 0 2: Test Horizontal Extraocular Movements - Normal + 0 3: Test Visual Fields - No Visual Loss + 0 4: Test Facial Palsy (Use Grimace if Obtunded) - Normal symmetry + 0 5A: Test Left Arm Motor Drift - No Drift for 10 Seconds + 0 5B: Test Right Arm Motor Drift -  Drift, but doesn't hit bed + 1 6A: Test Left Leg Motor Drift - No Drift for 5 Seconds + 0 6B: Test Right Leg Motor Drift - Drift, hits bed + 2 7: Test Limb Ataxia (FNF/Heel-Shin) - No Ataxia + 0 8: Test Sensation - Mild-Moderate Loss: Less Sharp/More Dull + 1 9: Test Language/Aphasia - Normal; No aphasia + 0 10: Test Dysarthria - Mild-Moderate Dysarthria: Slurring but can be understood + 1 11: Test Extinction/Inattention - No abnormality + 0  NIHSS Score: 5   Pre-Morbid Modified Rankin Scale: 1 Points = No significant disability despite symptoms; able to carry out all usual duties and activities  Spoke with : Dr Estell Harpin  Patient/Family was informed the Neurology Consult would occur via TeleHealth consult by way of interactive audio and video telecommunications and consented to receiving care in this manner.   Patient is being evaluated for possible acute neurologic impairment and high probability of imminent or life-threatening deterioration. I spent total of 40 minutes providing care to this patient, including time for face to face visit via telemedicine, review of medical records, imaging studies and discussion of findings with providers, the patient and/or family.   Dr Fortino Sic   [facility] For Inpatient follow-up with  [facility] physician please call [facility] [phone]. This is not an outpatient service. Post hospital discharge, please contact hospital directly.  Please do not communicate with [facility] physicians via secure chat. If you have any questions, Please contact [facility]. Please call or reconsult our service if there are any clinical or diagnostic changes.

## 2022-10-07 NOTE — ED Provider Notes (Signed)
[place] EMERGENCY DEPARTMENT AT [facility] Provider Note   CSN: [id number] Arrival date & time: 10/07/22  1553     History {Add pertinent medical, surgical, social history, OB history to HPI:1} Chief Complaint  Patient presents with   Code Stroke    Elizabeth Miranda is a 72 y.o. female with a past medical history of hypothyroidism, hypertension presents today for evaluation of right-sided weakness.  Patient states that she started to have weakness in her right arm and leg around 11 AM today.  She also reported decreased sensation to touch on the right side of her face.  Denies any double vision.  Patient is not taking any blood thinners.  Denies any recent fall or head injury.  She denies any pain, loss of consciousness, shortness of breath, dizziness, lightheadedness, nausea, vomiting.  HPI     Home Medications Prior to Admission medications   Medication Sig Start Date End Date Taking? Authorizing Provider  B Complex-C (B-COMPLEX WITH VITAMIN C) tablet Take 1 tablet by mouth daily.    [provider]  cholecalciferol (VITAMIN D3) 25 MCG (1000 UNIT) tablet Take 5,000 Units by mouth daily.    [provider]  ESTRADIOL PO Take 1.5 mg by mouth daily. Compound into a capsule. Per patient taking 0.5 mg    [provider]  Estradiol-Estriol-Progesterone (BIEST/PROGESTERONE TD) Place onto the skin. 80/20 compound. Per patient taking 1.5 mg daily    [provider]  levothyroxine (SYNTHROID, LEVOTHROID) 100 MCG tablet Take 1 tablet by mouth daily. 06/29/14   [provider]  liothyronine (CYTOMEL) 25 MCG tablet Take 25 mcg by mouth daily. 09/21/22   [provider]  losartan (COZAAR) 25 MG tablet Take 1 tablet (25 mg total) by mouth daily. 09/26/22   Rexford Maus, DO  Melatonin 5 MG/15ML LIQD Take 1 mg by mouth at bedtime as needed. spray    [provider]  metFORMIN (GLUCOPHAGE-XR) 500 MG 24 hr tablet Take  500 mg by mouth 2 (two) times daily. Per patient taking for PCOS    [provider]  Multiple Vitamin (MULITIVITAMIN WITH MINERALS) TABS Take 1 tablet by mouth daily.    [provider]      Allergies    Codeine and Sulfa antibiotics    Review of Systems   Review of Systems Negative except as per HPI.  Physical Exam Updated Vital Signs BP (!) 176/94   Pulse 72   Temp 98.2 F (36.8 C) (Oral)   Resp 16   Ht 5\' 3"  (1.6 m)   Wt 69.4 kg   SpO2 99%   BMI 27.10 kg/m  Physical Exam Vitals and nursing note reviewed.  Constitutional:      Appearance: Normal appearance.  HENT:     Head: Normocephalic and atraumatic.     Mouth/Throat:     Mouth: Mucous membranes are moist.  Eyes:     General: No scleral icterus. Cardiovascular:     Rate and Rhythm: Normal rate and regular rhythm.     Pulses: Normal pulses.     Heart sounds: Normal heart sounds.  Pulmonary:     Effort: Pulmonary effort is normal.     Breath sounds: Normal breath sounds.  Abdominal:     General: Abdomen is flat.     Palpations: Abdomen is soft.     Tenderness: There is no abdominal tenderness.  Musculoskeletal:        General: No deformity.  Skin:  General: Skin is warm.     Findings: No rash.  Neurological:     General: No focal deficit present.     Mental Status: She is alert.     Comments: Decreased grip strength of the right hand.  Normal strength of L arm and hand.  Right lower extremity strength 3/5.  Left lower extremity strength 5 out of 5. Decreased sensation to light touch to right side of face.  Intact finger-to-nose bilaterally.  No visual field cuts.  No neglect noted.  No aphasia noted.  No facial droop noted.   Psychiatric:        Mood and Affect: Mood normal.     ED Results / Procedures / Treatments   Labs (all labs ordered are listed, but only abnormal results are displayed) Labs Reviewed  COMPREHENSIVE METABOLIC PANEL - Abnormal; Notable for the following  components:      Result Value   Glucose, Bld 114 (*)    All other components within normal limits  URINALYSIS, ROUTINE W REFLEX MICROSCOPIC - Abnormal; Notable for the following components:   Color, Urine STRAW (*)    Specific Gravity, Urine 1.004 (*)    All other components within normal limits  CBG MONITORING, ED - Abnormal; Notable for the following components:   Glucose-Capillary 108 (*)    All other components within normal limits  ETHANOL  PROTIME-INR  APTT  CBC  DIFFERENTIAL  RAPID URINE DRUG SCREEN, HOSP PERFORMED  I-STAT CHEM 8, ED    EKG None  Radiology CT HEAD CODE STROKE WO CONTRAST  Result Date: 10/07/2022 CLINICAL DATA:  Code stroke.  Right-sided weakness EXAM: CT HEAD WITHOUT CONTRAST TECHNIQUE: Contiguous axial images were obtained from the base of the skull through the vertex without intravenous contrast. RADIATION DOSE REDUCTION: This exam was performed according to the departmental dose-optimization program which includes automated exposure control, adjustment of the mA and/or kV according to patient size and/or use of iterative reconstruction technique. COMPARISON:  09/27/2022 FINDINGS: Brain: No evidence of acute infarction, hemorrhage, mass, mass effect, or midline shift. No hydrocephalus or extra-axial collection. Vascular: No hyperdense vessel. Skull: Negative for fracture or focal lesion. Sinuses/Orbits: No acute finding. Other: The mastoid air cells are well aerated. ASPECTS [facility] Stroke Program Early CT Score) - Ganglionic level infarction (caudate, lentiform nuclei, internal capsule, insula, M1-M3 cortex): 7 - Supraganglionic infarction (M4-M6 cortex): 3 Total score (0-10 with 10 being normal): 10 IMPRESSION: 1. No acute intracranial process. 2. ASPECTS is 10 Code stroke imaging results were communicated on 10/07/2022 at 4:06 pm to provider ZAMMIT via telephone, who verbally acknowledged these results. Electronically Signed   By: Wiliam Ke M.D.   On:  10/07/2022 16:08    Procedures Procedures  {Document cardiac monitor, telemetry assessment procedure when appropriate:1}  Medications Ordered in ED Medications  butalbital-acetaminophen-caffeine (FIORICET) 50-325-40 MG per tablet 1 tablet (1 tablet Oral Given 10/07/22 1709)  iohexol (OMNIPAQUE) 350 MG/ML injection 100 mL (100 mLs Intravenous Contrast Given 10/07/22 1644)    ED Course/ Medical Decision Making/ A&P   {   Click here for ABCD2, HEART and other calculatorsREFRESH Note before signing :1}                          Medical Decision Making Amount and/or Complexity of Data Reviewed Labs: ordered. Radiology: ordered.  Risk Prescription drug management.   This patient presents to the ED for right-sided weakness, this involves an extensive number of treatment  options, and is a complaint that carries with a high risk of complications and morbidity.  The differential diagnosis includes CVA/ICH, TIA, Todd's paralysis.  This is not an exhaustive list.  Lab tests: I ordered and personally interpreted labs.  The pertinent results include: WBC unremarkable. Hbg unremarkable. Platelets unremarkable. Electrolytes unremarkable. BUN, creatinine unremarkable.  Imaging studies: I ordered imaging studies. I personally reviewed, interpreted imaging and agree with the radiologist's interpretations. The results include: CT angio head and neck showed mild to moderate stenosis in the distal right V2/proximal V3.  No other significant stenosis in the neck.  No intracranial large vessel occlusion or significant stenosis.  MRI brain ordered and pending.  Problem list/ ED course/ Critical interventions/ Medical management: HPI: See above Vital signs within normal range and stable throughout visit. Laboratory/imaging studies significant for: See above. On physical examination, patient is afebrile and appears in no acute distress. This patient presents with symptoms concerning for acute CVA versus  TIA. Other items on the differential include dissection, AMI, hypoglycemia or other metabolic derangement such as hepatic/uremic encephalopathy, medication side effect, or post-ictal Todd's paralysis. However, presentation most concerning for a CVA. EKG without evidence of STEMI or ischemia, labs with no hypoglycemia, metabolic derangements, and clinical picture does not suggest other stroke mimic. CT head negative. CTA head and neck showed mild to moderate stenosis in the distal right V2/proximal V3.  No other significant stenosis in the neck.  No intracranial large vessel occlusion or significant stenosis.  I have reviewed the patient home medicines and have made adjustments as needed.  Cardiac monitoring/EKG: The patient was maintained on a cardiac monitor.  I personally reviewed and interpreted the cardiac monitor which showed an underlying rhythm of: sinus rhythm.  Additional history obtained: External records from outside source obtained and reviewed including: Chart review including previous notes, labs, imaging.  Consultations obtained: I requested consultation with Dr. Cyndie Chime, and discussed lab and imaging findings as well as pertinent plan.  He/she recommended CT angio head neck, medical admission..  Disposition Patient care described at shift change to Dr. Estell Harpin, MD pending MRI.  Anticipated admission. This chart was dictated using voice recognition software.  Despite best efforts to proofread,  errors can occur which can change the documentation meaning.    {Document critical care time when appropriate:1} {Document review of labs and clinical decision tools ie heart score, Chads2Vasc2 etc:1}  {Document your independent review of radiology images, and any outside records:1} {Document your discussion with family members, caretakers, and with consultants:1} {Document social determinants of health affecting pt's care:1} {Document your decision making why or why not admission,  treatments were needed:1} Final Clinical Impression(s) / ED Diagnoses Final diagnoses:  Right sided weakness    Rx / DC Orders ED Discharge Orders     None

## 2022-10-07 NOTE — ED Notes (Signed)
4 mg Zofran en route given by [facility]

## 2022-10-07 NOTE — ED Notes (Signed)
Code STROKE called by EMS @ 1540. Beeped CT and Lab @ 1551 upon arrival

## 2022-10-07 NOTE — Consult Note (Signed)
Telestroke cart was activated at 1557. Per treatment team, pt's LKW was at 1200 with c/o R sided weakness/numbness, and slurred speech. EDP at bedside assessed pt prior to cart activation. Pt transported to CT prior to cart activation and returned to room at 1605. [facility] was paged for code stroke at 54. Dr. Cyndie Chime, [facility] appeared on telestroke cart at 1608 to assess the patient. Based on [facility]'s assessment, pt does not meet criteria for emergent interventions at this time 2/2 Current or Previous ICH. [facility] to f/u with EDP regarding recommendations. No further needs from Telest[facility] RN at this time. Telestroke cart disconnected at 1633.

## 2022-10-07 NOTE — ED Notes (Signed)
Pt transported to MRI 

## 2022-10-07 NOTE — H&P (Signed)
History and Physical    Patient: Elizabeth Miranda WNU:[id number] DOB: 1950/06/26 DOA: 10/07/2022 DOS: the patient was seen and examined on 10/08/2022 PCP: Benita Stabile, MD  Patient coming from: Home  Chief Complaint:  Chief Complaint  Patient presents with   Code Stroke   HPI: Elizabeth Miranda is a 72 y.o. female with medical history significant of hypertension, T2DM, hypothyroidism, history of intracranial hemorrhage with left occipital ICH who presents to the emergency department due to right-sided weakness and numbness which occurred around noon while sleeping.  Patient states that the sensation felt like an electric shock wave which went from her right foot to right side of face and head, she complained of headache and she states that she had similar presentation in January, though it was milder and she did not go to the ER/PCP at that time.  She states that she had a tick bite around June/July 2023 which ended being treated at that time.  She states that she had a small tick bite within the last week, unfortunately, she does not know the type of tick. Patient has been to cardiologist due to increasing shortness of breath that has been ongoing for several months, echocardiogram was done on 10/04/2021 and showed normal left ventricle systolic function, G1 DD, trivial MR, no structural cardiac issues to explain her shortness of breath.  ED Course:  In the emergency department, BP was 167/84, other vital signs are within normal range.  Workup in the ED showed normal CBC and BMP except for blood glucose of 114, urinalysis was normal, urine drug screen was normal, alcohol level was normal. CT head without contrast showed no acute intracranial process CT angiography of head and neck showed no intracranial large vessel occlusion or significant stenosis.  Mild to moderate stenosis in the distal right V2/proximal V3.  No other hemodynamically significant stenosis in the neck.  No evidence of infarct  core or penumbra on CT perfusion. MRI brain without contrast showed no acute intracranial process.  No evidence of acute or subacute infarct. Fioricet was given due to headache.  Teleneurologist was consulted for stroke alert, further workup was recommended.  Hospitalist was asked to admit patient for further evaluation and management.  Review of Systems: Review of systems as noted in the HPI. All other systems reviewed and are negative.   Past Medical History:  Diagnosis Date   Essential hypertension 10/08/2022   Hypothyroid    Palpitations    Past Surgical History:  Procedure Laterality Date   APPENDECTOMY     complete hysterectomy'     PARTIAL HYSTERECTOMY      Social History:  reports that she has never smoked. She has never used smokeless tobacco. She reports that she does not drink alcohol and does not use drugs.   Allergies  Allergen Reactions   Codeine     Hives    Sulfa Antibiotics Nausea And Vomiting    History reviewed. No pertinent family history.   Prior to Admission medications   Medication Sig Start Date End Date Taking? Authorizing Provider  B Complex-C (B-COMPLEX WITH VITAMIN C) tablet Take 1 tablet by mouth daily.    [provider]  cholecalciferol (VITAMIN D3) 25 MCG (1000 UNIT) tablet Take 5,000 Units by mouth daily.    [provider]  ESTRADIOL PO Take 1.5 mg by mouth daily. Compound into a capsule. Per patient taking 0.5 mg    [provider]  Estradiol-Estriol-Progesterone (BIEST/PROGESTERONE TD) Place onto the skin. 80/20  compound. Per patient taking 1.5 mg daily    [provider]  levothyroxine (SYNTHROID, LEVOTHROID) 100 MCG tablet Take 1 tablet by mouth daily. 06/29/14   [provider]  liothyronine (CYTOMEL) 25 MCG tablet Take 25 mcg by mouth daily. 09/21/22   [provider]  losartan (COZAAR) 25 MG tablet Take 1 tablet (25 mg total) by mouth daily. 09/26/22   Rexford Maus, DO   Melatonin 5 MG/15ML LIQD Take 1 mg by mouth at bedtime as needed. spray    [provider]  metFORMIN (GLUCOPHAGE-XR) 500 MG 24 hr tablet Take 500 mg by mouth 2 (two) times daily. Per patient taking for PCOS    [provider]  Multiple Vitamin (MULITIVITAMIN WITH MINERALS) TABS Take 1 tablet by mouth daily.    [provider]    Physical Exam: BP 123/68   Pulse 72   Temp 98 F (36.7 C) (Oral)   Resp 18   Ht 5\' 3"  (1.6 m)   Wt 69.4 kg   SpO2 97%   BMI 27.10 kg/m   General: 72 y.o. year-old female well developed well nourished in no acute distress.  Alert and oriented x3. HEENT: NCAT, EOMI Neck: Supple, trachea medial Cardiovascular: Regular rate and rhythm with no rubs or gallops.  No thyromegaly or JVD noted.  No lower extremity edema. 2/4 pulses in all 4 extremities. Respiratory: Clear to auscultation with no wheezes or rales. Good inspiratory effort. Abdomen: Soft, nontender nondistended with normal bowel sounds x4 quadrants. Muskuloskeletal: No cyanosis, clubbing or edema noted bilaterally Neuro: Right hand grip strength was 4/5, left hand grip strength 5/5.  Patient was unable to perform heel-to-shin movements with right leg, but was able to do so with left leg.  Decreased sensation on right side including right side of face.  Finger-to-nose movement was normal bilaterally. Skin: No ulcerative lesions noted or rashes Psychiatry: Judgement and insight appear normal. Mood is appropriate for condition and setting          Labs on Admission:  Basic Metabolic Panel: Recent Labs  Lab 10/07/22 1610  NA 138  K 3.7  CL 104  CO2 24  GLUCOSE 114*  BUN 17  CREATININE 0.64  CALCIUM 9.2   Liver Function Tests: Recent Labs  Lab 10/07/22 1610  AST 21  ALT 16  ALKPHOS 60  BILITOT 0.5  PROT 7.5  ALBUMIN 4.0   No results for input(s): "LIPASE", "AMYLASE" in the last 168 hours. No results for input(s): "AMMONIA" in the last 168  hours. CBC: Recent Labs  Lab 10/07/22 1610  WBC 6.0  NEUTROABS 2.9  HGB 13.0  HCT 39.0  MCV 90.3  PLT 323   Cardiac Enzymes: Recent Labs  Lab 10/02/22 1040  CKTOTAL 31*    BNP (last 3 results) Recent Labs    09/26/22 1009  BNP 7.8    ProBNP (last 3 results) No results for input(s): "PROBNP" in the last 8760 hours.  CBG: Recent Labs  Lab 10/07/22 1611  GLUCAP 108*    Radiological Exams on Admission: MR BRAIN WO CONTRAST  Result Date: 10/07/2022 CLINICAL DATA:  Stroke suspected; right face, arm, and leg weakness EXAM: MRI HEAD WITHOUT CONTRAST TECHNIQUE: Multiplanar, multiecho pulse sequences of the brain and surrounding structures were obtained without intravenous contrast. COMPARISON:  MRI head 09/27/2022, correlation is also made with CT head 10/07/2022 FINDINGS: Brain: No restricted diffusion to suggest acute or subacute infarct. No acute hemorrhage, mass, mass effect, or midline shift.  No hydrocephalus or extra-axial collection. Partial empty sella. Normal craniocervical junction. Redemonstrated focus of hemosiderin deposition left occipital lobe, compatible with prior hemorrhage. No other foci of hemosiderin deposition. Scattered T2 hyperintense signal in the periventricular white matter, likely the sequela of mild chronic small vessel ischemic disease. Vascular: Normal arterial flow voids. Skull and upper cervical spine: Normal marrow signal. Sinuses/Orbits: Clear paranasal sinuses. No acute finding in the orbits. Other: The mastoid air cells are well aerated. IMPRESSION: No acute intracranial process. No evidence of acute or subacute infarct. Electronically Signed   By: Wiliam Ke M.D.   On: 10/07/2022 19:15   CT ANGIO HEAD NECK W WO CM W PERF (CODE STROKE)  Result Date: 10/07/2022 CLINICAL DATA:  Right face, arm, and leg weakness EXAM: CT ANGIOGRAPHY HEAD AND NECK CT PERFUSION BRAIN TECHNIQUE: Multidetector CT imaging of the head and neck was performed using the  standard protocol during bolus administration of intravenous contrast. Multiplanar CT image reconstructions and MIPs were obtained to evaluate the vascular anatomy. Carotid stenosis measurements (when applicable) are obtained utilizing NASCET criteria, using the distal internal carotid diameter as the denominator. Multiphase CT imaging of the brain was performed following IV bolus contrast injection. Subsequent parametric perfusion maps were calculated using RAPID software. RADIATION DOSE REDUCTION: This exam was performed according to the departmental dose-optimization program which includes automated exposure control, adjustment of the mA and/or kV according to patient size and/or use of iterative reconstruction technique. CONTRAST:  OMNIPAQUE IOHEXOL 350 MG/ML SOLN COMPARISON:  10/07/2022 CT head, no prior CTA available FINDINGS: CT HEAD FINDINGS For noncontrast findings, please see same day CT head. CTA NECK FINDINGS Aortic arch: Standard branching. Imaged portion shows no evidence of aneurysm or dissection. No significant stenosis of the major arch vessel origins. Right carotid system: No evidence of dissection, occlusion, or hemodynamically significant stenosis (greater than 50%). Atherosclerotic disease at the bifurcation and in the proximal ICA is not hemodynamically significant. Left carotid system: No evidence of dissection, occlusion, or hemodynamically significant stenosis (greater than 50%). Atherosclerotic disease at the bifurcation and in the proximal ICA is not hemodynamically significant. Vertebral arteries: Mild to moderate stenosis in the distal right V2/proximal V3 (series 6, image 183). No other significant stenosis in the bilateral vertebral arteries. No evidence of dissection. Skeleton: No acute osseous abnormality. Degenerative changes in the cervical spine. Palatine torus. Other neck: Negative.  Aplastic or severely Upper chest: No focal pulmonary opacity or pleural effusion. Review of  the MIP images confirms the above findings CTA HEAD FINDINGS Anterior circulation: Both internal carotid arteries are patent to the termini, without significant stenosis. A1 segments patent. Normal anterior communicating artery. Anterior cerebral arteries are patent to their distal aspects without significant stenosis. No M1 stenosis or occlusion. MCA branches perfused to their distal aspects without significant stenosis. Posterior circulation: Vertebral arteries patent to the vertebrobasilar junction without significant stenosis. Posterior inferior cerebellar arteries patent proximally. Basilar patent to its distal aspect without significant stenosis. Superior cerebellar arteries patent proximally. Hypoplastic P1 segments. Fetal origin of the bilateral PCAs from the posterior communicating arteries. PCAs perfused to their distal aspects without significant stenosis. Venous sinuses: Patent. Anatomic variants: Fetal origin of the bilateral PCAs. Review of the MIP images confirms the above findings CT Brain Perfusion Findings: ASPECTS: 10 CBF (<30%) Volume: 0mL Perfusion (Tmax>6.0s) volume: 0mL Mismatch Volume: 0mL Infarction Location:None. IMPRESSION: 1. No intracranial large vessel occlusion or significant stenosis. 2. Mild to moderate stenosis in the distal right V2/proximal V3. No other hemodynamically  significant stenosis in the neck. 3. No evidence of infarct core or penumbra on CT perfusion. Electronically Signed   By: Wiliam Ke M.D.   On: 10/07/2022 17:29   CT HEAD CODE STROKE WO CONTRAST  Result Date: 10/07/2022 CLINICAL DATA:  Code stroke.  Right-sided weakness EXAM: CT HEAD WITHOUT CONTRAST TECHNIQUE: Contiguous axial images were obtained from the base of the skull through the vertex without intravenous contrast. RADIATION DOSE REDUCTION: This exam was performed according to the departmental dose-optimization program which includes automated exposure control, adjustment of the mA and/or kV according  to patient size and/or use of iterative reconstruction technique. COMPARISON:  09/27/2022 FINDINGS: Brain: No evidence of acute infarction, hemorrhage, mass, mass effect, or midline shift. No hydrocephalus or extra-axial collection. Vascular: No hyperdense vessel. Skull: Negative for fracture or focal lesion. Sinuses/Orbits: No acute finding. Other: The mastoid air cells are well aerated. ASPECTS [facility] Stroke Program Early CT Score) - Ganglionic level infarction (caudate, lentiform nuclei, internal capsule, insula, M1-M3 cortex): 7 - Supraganglionic infarction (M4-M6 cortex): 3 Total score (0-10 with 10 being normal): 10 IMPRESSION: 1. No acute intracranial process. 2. ASPECTS is 10 Code stroke imaging results were communicated on 10/07/2022 at 4:06 pm to provider ZAMMIT via telephone, who verbally acknowledged these results. Electronically Signed   By: Wiliam Ke M.D.   On: 10/07/2022 16:08    EKG: I independently viewed the EKG done and my findings are as followed: Normal sinus rhythm at a rate of 78 bpm  Assessment/Plan Present on Admission: **None**  Principal Problem:   Paresthesia of skin Active Problems:   Acquired hypothyroidism   Prediabetes   Essential hypertension   Headache  Paresthesia of skin rule out acute ischemic stroke Patient will be admitted to telemetry unit  CT head without contrast showed no acute intracranial process CT angiography of head and neck showed no intracranial large vessel occlusion or significant stenosis.   MRI of brain showed no acute intracranial process Echocardiogram done on 05/28/2022 showed LVEF EF of 60 to 5%.  No RWMA.  G1 DD.  Trivial MR.  Echocardiogram will be done in the morning EEG in the morning Continue aspirin  Continue fall precautions and neuro checks Lipid panel and hemoglobin A1c will be checked Continue PTOT eval and treat Bedside swallow eval by nursing prior to diet Neurology will be consulted in the  morning  Headache Continue Tylenol as needed  Essential hypertension  Antihypertensives PRN if Blood pressure is greater than 220/120 or there is a concern for End organ damage/contraindications for permissive HTN. If blood pressure is greater than 220/120 give labetalol PO or IV or Vasotec IV with a goal of 15% reduction in BP during the first 24 hours.  Prediabetes Hemoglobin A1c on 10/02/2022 was 5.7 Continue diet modification at this time  Acquired hypothyroidism Continue Synthroid  DVT prophylaxis: SCDs   Advance Care Planning: Full code  Consults: Teleneurology  Family Communication: Daughter at bedside (all questions answered to satisfaction)  Severity of Illness: The appropriate patient status for this patient is OBSERVATION. Observation status is judged to be reasonable and necessary in order to provide the required intensity of service to ensure the patient's safety. The patient's presenting symptoms, physical exam findings, and initial radiographic and laboratory data in the context of their medical condition is felt to place them at decreased risk for further clinical deterioration. Furthermore, it is anticipated that the patient will be medically stable for discharge from the hospital within 2 midnights of admission.  Author: Frankey Shown, DO 10/08/2022 1:41 AM  For on call review [email or web address].

## 2022-10-07 NOTE — ED Triage Notes (Signed)
Pt LKW 1200 today. Pt c/o right side weakness. Pt is in CT at this time

## 2022-10-07 NOTE — Telephone Encounter (Signed)
Pt is requesting a Provider switch from Dr. Elease Hashimoto to Dr. Shari Prows. Please advise.

## 2022-10-08 ENCOUNTER — Encounter (HOSPITAL_COMMUNITY): Payer: Self-pay | Admitting: Internal Medicine

## 2022-10-08 ENCOUNTER — Observation Stay (HOSPITAL_COMMUNITY): Payer: Medicare Other

## 2022-10-08 ENCOUNTER — Observation Stay (HOSPITAL_COMMUNITY)
Admit: 2022-10-08 | Discharge: 2022-10-08 | Disposition: A | Discharge status: Home / Self Care | Payer: Medicare Other | Attending: Internal Medicine | Admitting: Internal Medicine

## 2022-10-08 DIAGNOSIS — R7303 Prediabetes: Secondary | ICD-10-CM | POA: Insufficient documentation

## 2022-10-08 DIAGNOSIS — R569 Unspecified convulsions: Secondary | ICD-10-CM | POA: Diagnosis not present

## 2022-10-08 DIAGNOSIS — R519 Headache, unspecified: Secondary | ICD-10-CM | POA: Insufficient documentation

## 2022-10-08 DIAGNOSIS — I10 Essential (primary) hypertension: Secondary | ICD-10-CM

## 2022-10-08 DIAGNOSIS — R531 Weakness: Principal | ICD-10-CM

## 2022-10-08 DIAGNOSIS — R202 Paresthesia of skin: Secondary | ICD-10-CM | POA: Diagnosis not present

## 2022-10-08 DIAGNOSIS — E039 Hypothyroidism, unspecified: Secondary | ICD-10-CM | POA: Insufficient documentation

## 2022-10-08 DIAGNOSIS — M5412 Radiculopathy, cervical region: Secondary | ICD-10-CM | POA: Diagnosis not present

## 2022-10-08 HISTORY — DX: Essential (primary) hypertension: I10

## 2022-10-08 LAB — CBC
HCT: 37.7 % (ref 36.0–46.0)
Hemoglobin: 12.6 g/dL (ref 12.0–15.0)
MCH: 30.2 pg (ref 26.0–34.0)
MCHC: 33.4 g/dL (ref 30.0–36.0)
MCV: 90.4 fL (ref 80.0–100.0)
Platelets: 310 10*3/uL (ref 150–400)
RBC: 4.17 MIL/uL (ref 3.87–5.11)
RDW: 12.1 % (ref 11.5–15.5)
WBC: 6.4 10*3/uL (ref 4.0–10.5)
nRBC: 0 % (ref 0.0–0.2)

## 2022-10-08 LAB — PHOSPHORUS: Phosphorus: 3.5 mg/dL (ref 2.5–4.6)

## 2022-10-08 LAB — COMPREHENSIVE METABOLIC PANEL
ALT: 19 U/L (ref 0–44)
AST: 23 U/L (ref 15–41)
Albumin: 3.8 g/dL (ref 3.5–5.0)
Alkaline Phosphatase: 56 U/L (ref 38–126)
Anion gap: 9 (ref 5–15)
BUN: 15 mg/dL (ref 8–23)
CO2: 23 mmol/L (ref 22–32)
Calcium: 8.8 mg/dL — ABNORMAL LOW (ref 8.9–10.3)
Chloride: 104 mmol/L (ref 98–111)
Creatinine, Ser: 0.68 mg/dL (ref 0.44–1.00)
GFR, Estimated: >60 mL/min (ref >60)
Glucose, Bld: 88 mg/dL (ref 70–99)
Potassium: 3.6 mmol/L (ref 3.5–5.1)
Sodium: 136 mmol/L (ref 135–145)
Total Bilirubin: 0.6 mg/dL (ref 0.3–1.2)
Total Protein: 7 g/dL (ref 6.5–8.1)

## 2022-10-08 LAB — LIPID PANEL
Cholesterol: 187 mg/dL (ref 0–200)
HDL: 42 mg/dL (ref >40)
LDL Cholesterol: 99 mg/dL (ref 0–99)
Total CHOL/HDL Ratio: 4.5 RATIO
Triglycerides: 229 mg/dL — ABNORMAL HIGH (ref <150)
VLDL: 46 mg/dL — ABNORMAL HIGH (ref 0–40)

## 2022-10-08 LAB — MAGNESIUM: Magnesium: 2 mg/dL (ref 1.7–2.4)

## 2022-10-08 LAB — HEMOGLOBIN A1C
Hgb A1c MFr Bld: 5.7 % — ABNORMAL HIGH (ref 4.8–5.6)
Mean Plasma Glucose: 116.89 mg/dL

## 2022-10-08 MED ORDER — ACETAMINOPHEN 325 MG PO TABS
650.0000 mg | ORAL_TABLET | Freq: Four times a day (QID) | ORAL | Status: DC | PRN
  Administered 2022-10-08 (×2): 650 mg via ORAL
  Filled 2022-10-08 (×2): qty 2

## 2022-10-08 MED ORDER — ONDANSETRON HCL 4 MG PO TABS: 4.0000 mg | ORAL_TABLET | Freq: Four times a day (QID) | ORAL | Status: DC | PRN

## 2022-10-08 MED ORDER — GADOBUTROL 1 MMOL/ML IV SOLN
7.0000 mL | Freq: Once | INTRAVENOUS | Status: AC | PRN
  Administered 2022-10-08: 7 mL via INTRAVENOUS

## 2022-10-08 MED ORDER — ASPIRIN 81 MG PO TBEC
81.0000 mg | DELAYED_RELEASE_TABLET | Freq: Every day | ORAL | Status: DC
  Administered 2022-10-08: 81 mg via ORAL
  Filled 2022-10-08: qty 1

## 2022-10-08 MED ORDER — METFORMIN HCL ER 500 MG PO TB24: 500.0000 mg | ORAL_TABLET | Freq: Two times a day (BID) | ORAL | 3 refills | Status: DC

## 2022-10-08 MED ORDER — TRAZODONE HCL 50 MG PO TABS: 100.0000 mg | ORAL_TABLET | Freq: Every day | ORAL | Status: DC

## 2022-10-08 MED ORDER — ASPIRIN 81 MG PO TBEC: 81.0000 mg | DELAYED_RELEASE_TABLET | Freq: Every day | ORAL | 11 refills | Status: DC

## 2022-10-08 MED ORDER — ACETAMINOPHEN 325 MG PO TABS: 650.0000 mg | ORAL_TABLET | Freq: Four times a day (QID) | ORAL | 3 refills | Status: AC | PRN

## 2022-10-08 MED ORDER — ONDANSETRON HCL 4 MG/2ML IJ SOLN: 4.0000 mg | Freq: Four times a day (QID) | INTRAMUSCULAR | Status: DC | PRN

## 2022-10-08 MED ORDER — LEVOTHYROXINE SODIUM 100 MCG PO TABS
100.0000 ug | ORAL_TABLET | Freq: Every day | ORAL | Status: DC
  Administered 2022-10-08: 100 ug via ORAL
  Filled 2022-10-08: qty 2

## 2022-10-08 MED ORDER — SODIUM CHLORIDE 0.9 % IV BOLUS
1000.0000 mL | Freq: Once | INTRAVENOUS | Status: AC
  Administered 2022-10-08: 1000 mL via INTRAVENOUS

## 2022-10-08 MED ORDER — POLYVINYL ALCOHOL 1.4 % OP SOLN
1.0000 [drp] | OPHTHALMIC | Status: DC | PRN
  Filled 2022-10-08: qty 15

## 2022-10-08 NOTE — Discharge Instructions (Signed)
1)Please follow up with your Neurologist Dr. Marjory Lies, Satira Sark R in about 1 week for recheck 2)Outpatient Physical and Occupational Therapy as advised 3)Please Hold Metformin until Friday, 10/11/2022 due to contrast study

## 2022-10-08 NOTE — Discharge Summary (Signed)
Elizabeth Miranda, is a 72 y.o. female  DOB [birth year]  MRN [id number].  Admission date:  10/07/2022  Admitting Physician  Frankey Shown, DO  Discharge Date:  10/08/2022   Primary MD  Benita Stabile, MD  Recommendations for primary care physician for things to follow:   1)Please follow up with your Neurologist Dr. Marjory Lies, Satira Sark R in about 1 week for recheck 2)Outpatient Physical and Occupational Therapy as advised 3)Please Hold Metformin until Friday, 10/11/2022 due to contrast study  Admission Diagnosis  Right sided weakness [R53.1]  Discharge Diagnosis  Right sided weakness [R53.1]    Principal Problem:   Paresthesia of skin Active Problems:   Acquired hypothyroidism   Prediabetes   Essential hypertension   Headache      Past Medical History:  Diagnosis Date   Essential hypertension 10/08/2022   Hypothyroid    Palpitations     Past Surgical History:  Procedure Laterality Date   APPENDECTOMY     complete hysterectomy'     PARTIAL HYSTERECTOMY       HPI  from the history and physical done on the day of admission:   HPI: Elizabeth Miranda is a 72 y.o. female with medical history significant of hypertension, T2DM, hypothyroidism, history of intracranial hemorrhage with left occipital ICH who presents to the emergency department due to right-sided weakness and numbness which occurred around noon while sleeping.  Patient states that the sensation felt like an electric shock wave which went from her right foot to right side of face and head, she complained of headache and she states that she had similar presentation in January, though it was milder and she did not go to the ER/PCP at that time.  She states that she had a tick bite around June/July 2023 which ended being treated at that time.  She states that she had a small tick bite within the last week, unfortunately, she does not know the  type of tick. Patient has been to cardiologist due to increasing shortness of breath that has been ongoing for several months, echocardiogram was done on 10/04/2021 and showed normal left ventricle systolic function, G1 DD, trivial MR, no structural cardiac issues to explain her shortness of breath.   ED Course:  In the emergency department, BP was 167/84, other vital signs are within normal range.  Workup in the ED showed normal CBC and BMP except for blood glucose of 114, urinalysis was normal, urine drug screen was normal, alcohol level was normal. CT head without contrast showed no acute intracranial process CT angiography of head and neck showed no intracranial large vessel occlusion or significant stenosis.  Mild to moderate stenosis in the distal right V2/proximal V3.  No other hemodynamically significant stenosis in the neck.  No evidence of infarct core or penumbra on CT perfusion. MRI brain without contrast showed no acute intracranial process.  No evidence of acute or subacute infarct. Fioricet was given due to headache.  Teleneurologist was consulted for stroke alert, further workup was recommended.  Hospitalist was asked to admit patient for further evaluation and management.   Review of Systems: Review of systems as noted in the HPI. All other systems reviewed and are negative.   Hospital Course:   1)Right-sided weakness and numbness with paresthesia -??  Related to cervical foraminal stenosis -on telemetry monitored unit no significant arrhythmias CT head without contrast showed no acute intracranial process CT angiography of head and neck showed no intracranial large vessel occlusion or significant stenosis.   MRI of brain without acute intracranial process Echocardiogram done on 05/28/2022 showed LVEF EF of 60 to 5%.  No RWMA.  G1 DD.  Trivial MR.   EEG without epileptiform findings Continue aspirin  Lipid panel with LDL of 99 and HDL of 42- -PTOT eval appreciated recommends  outpatient follow-up -Reviewed with Dr. Melynda Ripple and Dr. Bing Neighbors -Dr. Vladimir Crofts recommended MRI of the C-spine to complete patient's workup -C-spine MRI with bilateral neuroforaminal stenosis which may explain patient's numbness outpatient follow-up with neurologist advised  2)Headache Continue Tylenol as needed   3)Essential hypertension -.  Continue losartan   4)Prediabetes Hemoglobin A1c  5.7 Continue diet modification at this time -Hold metformin due to contrast exposure--please see discharge instructions   5)Acquired hypothyroidism Continue Synthroid and liothyronine   Discharge Condition: Stable discharge home with outpatient OT and PT follow-up  Follow UP   Follow-up Information     Penumalli, Glenford Bayley, MD. Call in 1 week(s).   Specialties: Neurology, Radiology Contact information: [address] [place] [address] [phone]                  Consults obtained -neurology  Diet and Activity recommendation:  As advised  Discharge Instructions    Discharge Instructions     Ambulatory referral to Occupational Therapy   Complete by: As directed    Ambulatory referral to Physical Therapy   Complete by: As directed    Call MD for:  difficulty breathing, headache or visual disturbances   Complete by: As directed    Call MD for:  persistant dizziness or light-headedness   Complete by: As directed    Call MD for:  persistant nausea and vomiting   Complete by: As directed    Call MD for:  temperature >100.4   Complete by: As directed    Diet - low sodium heart healthy   Complete by: As directed    Diet Carb Modified   Complete by: As directed    Discharge instructions   Complete by: As directed    1)Please follow up with your Neurologist Dr. Marjory Lies, Satira Sark R in about 1 week for recheck 2)Outpatient Physical and Occupational Therapy as advised 3)Please Hold Metformin until Friday, 10/11/2022 due to contrast study   Increase  activity slowly   Complete by: As directed         Discharge Medications     Allergies as of 10/08/2022       Reactions   Codeine    Hives   Sulfa Antibiotics Nausea And Vomiting        Medication List     TAKE these medications    acetaminophen 325 MG tablet Commonly known as: TYLENOL Take 2 tablets (650 mg total) by mouth every 6 (six) hours as needed for mild pain, moderate pain or fever.   aspirin EC 81 MG tablet Take 1 tablet (81 mg total) by mouth daily with breakfast. Swallow whole.   B-complex with vitamin C tablet Take 1 tablet  by mouth daily.   cholecalciferol 25 MCG (1000 UNIT) tablet Commonly known as: VITAMIN D3 Take 5,000 Units by mouth daily.   ESTRADIOL PO Take 1.5 mg by mouth daily. Compound into a capsule   levothyroxine 100 MCG tablet Commonly known as: SYNTHROID Take 1 tablet by mouth daily.   liothyronine 25 MCG tablet Commonly known as: CYTOMEL Take 25 mcg by mouth daily.   losartan 25 MG tablet Commonly known as: Cozaar Take 1 tablet (25 mg total) by mouth daily.   metFORMIN 500 MG 24 hr tablet Commonly known as: GLUCOPHAGE-XR Take 1 tablet (500 mg total) by mouth 2 (two) times daily. Per patient taking for PCOS Start taking on: October 11, 2022 What changed: These instructions start on October 11, 2022. If you are unsure what to do until then, ask your doctor or other care provider.   multivitamin with minerals Tabs tablet Take 1 tablet by mouth daily.               Durable Medical Equipment  (From admission, onward)           Start     Ordered   10/08/22 1517  For home use only DME Walker rolling  Once       Comments: Patient unsteady on feet at high risk for falls without AD, good return for use of RW without loss of balance.  Question Answer Comment  Walker: With 5 Inch Wheels   Patient needs a walker to treat with the following condition Gait difficulty      10/08/22 1518            Major procedures and  Radiology Reports - PLEASE review detailed and final reports for all details, in brief -    EEG adult  Result Date: 10/08/2022 Charlsie Quest, MD     10/08/2022  3:28 PM Patient Name: VERDINE DRING MRN: [id number] Epilepsy Attending: Charlsie Quest Referring Physician/Provider: Frankey Shown, DO Date: 10/08/2022 Duration: 22.41 mins Patient history: 72yoF getting eeg to evaluate for seizure Level of alertness: Awake, asleep AEDs during EEG study: None Technical aspects: This EEG study was done with scalp electrodes positioned according to the 10-20 International system of electrode placement. Electrical activity was reviewed with band pass filter of 1-70Hz , sensitivity of 7 uV/mm, display speed of 12mm/sec with a 60Hz  notched filter applied as appropriate. EEG data were recorded continuously and digitally stored.  Video monitoring was available and reviewed as appropriate. Description: The posterior dominant rhythm consists of 9-10 Hz activity of moderate voltage (25-35 uV) seen predominantly in posterior head regions, symmetric and reactive to eye opening and eye closing. Sleep was characterized by vertex waves, sleep spindles (12 to 14 Hz), maximal frontocentral region.  Hyperventilation and photic stimulation were not performed.   IMPRESSION: This study is within normal limits. No seizures or epileptiform discharges were seen throughout the recording. A normal interictal EEG does not exclude the diagnosis of epilepsy. Charlsie Quest   MR BRAIN WO CONTRAST  Result Date: 10/07/2022 CLINICAL DATA:  Stroke suspected; right face, arm, and leg weakness EXAM: MRI HEAD WITHOUT CONTRAST TECHNIQUE: Multiplanar, multiecho pulse sequences of the brain and surrounding structures were obtained without intravenous contrast. COMPARISON:  MRI head 09/27/2022, correlation is also made with CT head 10/07/2022 FINDINGS: Brain: No restricted diffusion to suggest acute or subacute infarct. No acute hemorrhage,  mass, mass effect, or midline shift. No hydrocephalus or extra-axial collection. Partial empty sella. Normal craniocervical junction. Redemonstrated focus  of hemosiderin deposition left occipital lobe, compatible with prior hemorrhage. No other foci of hemosiderin deposition. Scattered T2 hyperintense signal in the periventricular white matter, likely the sequela of mild chronic small vessel ischemic disease. Vascular: Normal arterial flow voids. Skull and upper cervical spine: Normal marrow signal. Sinuses/Orbits: Clear paranasal sinuses. No acute finding in the orbits. Other: The mastoid air cells are well aerated. IMPRESSION: No acute intracranial process. No evidence of acute or subacute infarct. Electronically Signed   By: Wiliam Ke M.D.   On: 10/07/2022 19:15   CT ANGIO HEAD NECK W WO CM W PERF (CODE STROKE)  Result Date: 10/07/2022 CLINICAL DATA:  Right face, arm, and leg weakness EXAM: CT ANGIOGRAPHY HEAD AND NECK CT PERFUSION BRAIN TECHNIQUE: Multidetector CT imaging of the head and neck was performed using the standard protocol during bolus administration of intravenous contrast. Multiplanar CT image reconstructions and MIPs were obtained to evaluate the vascular anatomy. Carotid stenosis measurements (when applicable) are obtained utilizing NASCET criteria, using the distal internal carotid diameter as the denominator. Multiphase CT imaging of the brain was performed following IV bolus contrast injection. Subsequent parametric perfusion maps were calculated using RAPID software. RADIATION DOSE REDUCTION: This exam was performed according to the departmental dose-optimization program which includes automated exposure control, adjustment of the mA and/or kV according to patient size and/or use of iterative reconstruction technique. CONTRAST:  OMNIPAQUE IOHEXOL 350 MG/ML SOLN COMPARISON:  10/07/2022 CT head, no prior CTA available FINDINGS: CT HEAD FINDINGS For noncontrast findings, please see  same day CT head. CTA NECK FINDINGS Aortic arch: Standard branching. Imaged portion shows no evidence of aneurysm or dissection. No significant stenosis of the major arch vessel origins. Right carotid system: No evidence of dissection, occlusion, or hemodynamically significant stenosis (greater than 50%). Atherosclerotic disease at the bifurcation and in the proximal ICA is not hemodynamically significant. Left carotid system: No evidence of dissection, occlusion, or hemodynamically significant stenosis (greater than 50%). Atherosclerotic disease at the bifurcation and in the proximal ICA is not hemodynamically significant. Vertebral arteries: Mild to moderate stenosis in the distal right V2/proximal V3 (series 6, image 183). No other significant stenosis in the bilateral vertebral arteries. No evidence of dissection. Skeleton: No acute osseous abnormality. Degenerative changes in the cervical spine. Palatine torus. Other neck: Negative.  Aplastic or severely Upper chest: No focal pulmonary opacity or pleural effusion. Review of the MIP images confirms the above findings CTA HEAD FINDINGS Anterior circulation: Both internal carotid arteries are patent to the termini, without significant stenosis. A1 segments patent. Normal anterior communicating artery. Anterior cerebral arteries are patent to their distal aspects without significant stenosis. No M1 stenosis or occlusion. MCA branches perfused to their distal aspects without significant stenosis. Posterior circulation: Vertebral arteries patent to the vertebrobasilar junction without significant stenosis. Posterior inferior cerebellar arteries patent proximally. Basilar patent to its distal aspect without significant stenosis. Superior cerebellar arteries patent proximally. Hypoplastic P1 segments. Fetal origin of the bilateral PCAs from the posterior communicating arteries. PCAs perfused to their distal aspects without significant stenosis. Venous sinuses: Patent.  Anatomic variants: Fetal origin of the bilateral PCAs. Review of the MIP images confirms the above findings CT Brain Perfusion Findings: ASPECTS: 10 CBF (<30%) Volume: 0mL Perfusion (Tmax>6.0s) volume: 0mL Mismatch Volume: 0mL Infarction Location:None. IMPRESSION: 1. No intracranial large vessel occlusion or significant stenosis. 2. Mild to moderate stenosis in the distal right V2/proximal V3. No other hemodynamically significant stenosis in the neck. 3. No evidence of infarct core or penumbra  on CT perfusion. Electronically Signed   By: Wiliam Ke M.D.   On: 10/07/2022 17:29   CT HEAD CODE STROKE WO CONTRAST  Result Date: 10/07/2022 CLINICAL DATA:  Code stroke.  Right-sided weakness EXAM: CT HEAD WITHOUT CONTRAST TECHNIQUE: Contiguous axial images were obtained from the base of the skull through the vertex without intravenous contrast. RADIATION DOSE REDUCTION: This exam was performed according to the departmental dose-optimization program which includes automated exposure control, adjustment of the mA and/or kV according to patient size and/or use of iterative reconstruction technique. COMPARISON:  09/27/2022 FINDINGS: Brain: No evidence of acute infarction, hemorrhage, mass, mass effect, or midline shift. No hydrocephalus or extra-axial collection. Vascular: No hyperdense vessel. Skull: Negative for fracture or focal lesion. Sinuses/Orbits: No acute finding. Other: The mastoid air cells are well aerated. ASPECTS [facility] Stroke Program Early CT Score) - Ganglionic level infarction (caudate, lentiform nuclei, internal capsule, insula, M1-M3 cortex): 7 - Supraganglionic infarction (M4-M6 cortex): 3 Total score (0-10 with 10 being normal): 10 IMPRESSION: 1. No acute intracranial process. 2. ASPECTS is 10 Code stroke imaging results were communicated on 10/07/2022 at 4:06 pm to provider ZAMMIT via telephone, who verbally acknowledged these results. Electronically Signed   By: Wiliam Ke M.D.   On: 10/07/2022  16:08   MR Brain Wo Contrast (neuro protocol)  Result Date: 09/27/2022 CLINICAL DATA:  Syncope/presyncope, cerebrovascular cause suspected EXAM: MRI HEAD WITHOUT CONTRAST TECHNIQUE: Multiplanar, multiecho pulse sequences of the brain and surrounding structures were obtained without intravenous contrast. COMPARISON:  CT head from the same day. FINDINGS: Brain: Focus of T2 hypointensity and susceptibility artifact in the left occipital lobe, compatible with prior hemorrhage. No surrounding edema or evidence of acute hemorrhage. No evidence of acute infarct, mass lesion, midline shift or hydrocephalus. Mild for age T2/FLAIR hyperintensities the white matter, compatible with chronic microvascular ischemic disease. Vascular: Major arterial flow voids are maintained at the skull base. Skull and upper cervical spine: Normal marrow signal. Sinuses/Orbits: Negative. Other: No mastoid effusions. IMPRESSION: 1. No evidence of acute intracranial abnormality. 2. Prior left occipital hemorrhage. Electronically Signed   By: Feliberto Harts M.D.   On: 09/27/2022 18:20   CT Head Wo Contrast  Result Date: 09/27/2022 CLINICAL DATA:  Acute stroke suspected, neuro deficit EXAM: CT HEAD WITHOUT CONTRAST TECHNIQUE: Contiguous axial images were obtained from the base of the skull through the vertex without intravenous contrast. RADIATION DOSE REDUCTION: This exam was performed according to the departmental dose-optimization program which includes automated exposure control, adjustment of the mA and/or kV according to patient size and/or use of iterative reconstruction technique. COMPARISON:  None Available. FINDINGS: Brain: No evidence of acute infarction, hemorrhage, hydrocephalus, extra-axial collection or mass lesion/mass effect. Mild periventricular white matter hypodensity. Vascular: No hyperdense vessel or unexpected calcification. Skull: Normal. Negative for fracture or focal lesion. Sinuses/Orbits: No acute finding.  Other: None. IMPRESSION: No acute intracranial pathology. Small-vessel white matter disease. Consider MRI to more sensitively assess for acute diffusion restricting infarction if clinically suspected. Electronically Signed   By: Jearld Lesch M.D.   On: 09/27/2022 14:32   DG Chest 2 View  Result Date: 09/27/2022 CLINICAL DATA:  Chest pain EXAM: CHEST - 2 VIEW COMPARISON:  None Available. FINDINGS: The lungs are clear and negative for focal airspace consolidation, pulmonary edema or suspicious pulmonary nodule. No pleural effusion or pneumothorax. Cardiac and mediastinal contours are within normal limits. No acute fracture or lytic or blastic osseous lesions. The visualized upper abdominal bowel gas pattern is unremarkable.  IMPRESSION: No active cardiopulmonary disease. Electronically Signed   By: Malachy Moan M.D.   On: 09/27/2022 11:25   DG Chest 2 View  Result Date: 09/26/2022 CLINICAL DATA:  Shortness of breath with activity over the last 3 months. Symptoms are progressing. EXAM: CHEST - 2 VIEW COMPARISON:  None Available. FINDINGS: The heart size and mediastinal contours are within normal limits. Both lungs are clear. The visualized skeletal structures are unremarkable. IMPRESSION: Negative two view chest x-ray Electronically Signed   By: Marin Roberts M.D.   On: 09/26/2022 10:30    Micro Results   Today   Subjective    Montgomery Bartoo today has no new complaints - Right-sided residual weakness and numbness persist, but not worse Patient's son and daughter at bedside, questions answered        Patient has been seen and examined prior to discharge   Objective   Blood pressure (!) 144/84, pulse 86, temperature 98.6 F (37 C), temperature source Oral, resp. rate 18, height 5\' 3"  (1.6 m), weight 69.4 kg, SpO2 99 %.   Intake/Output Summary (Last 24 hours) at 10/08/2022 1844 Last data filed at 10/08/2022 1813 Gross per 24 hour  Intake 480 ml  Output --  Net 480 ml     Exam Gen:- Awake Alert, no acute distress , looks younger than stated age HEENT:- [place].AT, No sclera icterus Neck-Supple Neck,No JVD,.  Lungs-  CTAB , good air movement bilaterally CV- S1, S2 normal, regular Abd-  +ve B.Sounds, Abd Soft, No tenderness,    Extremity/Skin:- No  edema,   good pulses Psych-affect is appropriate, oriented x3 Neuro-subjective residual right-sided upper and lower extremity numbness, subtle weakness, no additional new focal deficits, no tremors    Data Review   CBC w Diff:  Lab Results  Component Value Date   WBC 6.4 10/08/2022   HGB 12.6 10/08/2022   HGB 14.2 05/27/2022   HCT 37.7 10/08/2022   HCT 42.9 05/27/2022   PLT 310 10/08/2022   PLT 380 05/27/2022   LYMPHOPCT 37 10/07/2022   MONOPCT 13 10/07/2022   EOSPCT 2 10/07/2022   BASOPCT 0 10/07/2022    CMP:  Lab Results  Component Value Date   NA 136 10/08/2022   NA 140 08/27/2022   K 3.6 10/08/2022   CL 104 10/08/2022   CO2 23 10/08/2022   BUN 15 10/08/2022   BUN 19 08/27/2022   CREATININE 0.68 10/08/2022   PROT 7.0 10/08/2022   PROT 7.8 05/27/2022   ALBUMIN 3.8 10/08/2022   ALBUMIN 4.8 05/27/2022   BILITOT 0.6 10/08/2022   BILITOT 0.2 05/27/2022   ALKPHOS 56 10/08/2022   AST 23 10/08/2022   ALT 19 10/08/2022  .  Total Discharge time is about 33 minutes  Shon Hale M.D on 10/08/2022 at 6:44 PM  Go to [email or web address] -  for contact info  [facility] - Office  [phone]

## 2022-10-08 NOTE — Plan of Care (Signed)
  Problem: Acute Rehab PT Goals(only PT should resolve) Goal: Pt Will Go Supine/Side To Sit Outcome: Progressing Flowsheets (Taken 10/08/2022 1451) Pt will go Supine/Side to Sit:  Independently  with modified independence Goal: Patient Will Transfer Sit To/From Stand Outcome: Progressing Flowsheets (Taken 10/08/2022 1451) Patient will transfer sit to/from stand:  with supervision  with modified independence Goal: Pt Will Transfer Bed To Chair/Chair To Bed Outcome: Progressing Flowsheets (Taken 10/08/2022 1451) Pt will Transfer Bed to Chair/Chair to Bed:  with supervision  with modified independence Goal: Pt Will Ambulate Outcome: Progressing Flowsheets (Taken 10/08/2022 1451) Pt will Ambulate:  75 feet  with min guard assist  with rolling walker   2:52 PM, 10/08/22 Elizabeth Miranda, MPT Physical Therapist with [facility] [phone] office [phone] mobile phone

## 2022-10-08 NOTE — ED Notes (Signed)
Pt c/o of a headache and wanting to eat. Provider made aware. See orders

## 2022-10-08 NOTE — Procedures (Signed)
Patient Name: Elizabeth Miranda  MRN: [id number]  Epilepsy Attending: Charlsie Quest  Referring Physician/Provider: Frankey Shown, DO  Date: 10/08/2022 Duration: 22.41 mins  Patient history: 72yoF getting eeg to evaluate for seizure  Level of alertness: Awake, asleep  AEDs during EEG study: None  Technical aspects: This EEG study was done with scalp electrodes positioned according to the 10-20 International system of electrode placement. Electrical activity was reviewed with band pass filter of 1-70Hz , sensitivity of 7 uV/mm, display speed of 40mm/sec with a  notched filter applied as appropriate. EEG data were recorded continuously and digitally stored.  Video monitoring was available and reviewed as appropriate.  Description: The posterior dominant rhythm consists of 9-10 Hz activity of moderate voltage (25-35 uV) seen predominantly in posterior head regions, symmetric and reactive to eye opening and eye closing. Sleep was characterized by vertex waves, sleep spindles (12 to 14 Hz), maximal frontocentral region.  Hyperventilation and photic stimulation were not performed.     IMPRESSION: This study is within normal limits. No seizures or epileptiform discharges were seen throughout the recording.  A normal interictal EEG does not exclude the diagnosis of epilepsy.   Eliam Snapp Annabelle Harman

## 2022-10-08 NOTE — Evaluation (Addendum)
Physical Therapy Evaluation Patient Details Name: Elizabeth Miranda MRN: [id number] DOB: Jan 10, 1951 Today's Date: 10/08/2022  History of Present Illness  CHARMON THORSON is a 72 y.o. female with medical history significant of hypertension, T2DM, hypothyroidism, history of intracranial hemorrhage with left occipital ICH who presents to the emergency department due to right-sided weakness and numbness which occurred around noon while sleeping.  Patient states that the sensation felt like an electric shock wave which went from her right foot to right side of face and head, she complained of headache and she states that she had similar presentation in January, though it was milder and she did not go to the ER/PCP at that time.  She states that she had a tick bite around June/July 2023 which ended being treated at that time.  She states that she had a small tick bite within the last week, unfortunately, she does not know the type of tick.   Clinical Impression  Patient demonstrates good return for sitting up at bedside, RLE slightly weaker than left and tends to sway trunk frequently when taking steps without AD with near loss of balance, required use of RW for safety demonstrating good return for using.  Patient tolerated sitting up in wheelchair to be taken to medical floor by nursing staff.  Patient will benefit from continued skilled physical therapy in hospital and recommended venue below to increase strength, balance, endurance for safe ADLs and gait.      Recommendations for follow up therapy are one component of a multi-disciplinary discharge planning process, led by the attending physician.  Recommendations may be updated based on patient status, additional functional criteria and insurance authorization.  Follow Up Recommendations       Assistance Recommended at Discharge Set up Supervision/Assistance  Patient can return home with the following  A little help with walking and/or transfers;A  little help with bathing/dressing/bathroom;Help with stairs or ramp for entrance;Assistance with cooking/housework    Equipment Recommendations Rolling walker (2 wheels)  Recommendations for Other Services       Functional Status Assessment Patient has had a recent decline in their functional status and demonstrates the ability to make significant improvements in function in a reasonable and predictable amount of time.     Precautions / Restrictions Precautions Precautions: Fall Restrictions Weight Bearing Restrictions: No      Mobility  Bed Mobility Overal bed mobility: Modified Independent                  Transfers Overall transfer level: Needs assistance   Transfers: Sit to/from Stand, Bed to chair/wheelchair/BSC Sit to Stand: Min guard   Step pivot transfers: Min guard, Min assist       General transfer comment: slow labored movement with decreased standing balance    Ambulation/Gait Ambulation/Gait assistance: Min guard, Min assist Gait Distance (Feet): 50 Feet Assistive device: Rolling walker (2 wheels), None, 1 person hand held assist Gait Pattern/deviations: Decreased step length - right, Decreased step length - left, Decreased stride length Gait velocity: decreased     General Gait Details: slow labored movement with frequent near loss of balance without AD, required use of RW for safety with good return for use demonstrated  Stairs            Wheelchair Mobility    Modified Rankin (Stroke Patients Only)       Balance  Pertinent Vitals/Pain Pain Assessment Pain Assessment: 0-10 Pain Score: 8  Pain Descriptors / Indicators: Headache, Numbness Pain Intervention(s): Limited activity within patient's tolerance, Monitored during session, Repositioned    Home Living Family/patient expects to be discharged to:: Private residence Living Arrangements: Spouse/significant  other Available Help at Discharge: Family;Available 24 hours/day Type of Home: House Home Access: Stairs to enter Entrance Stairs-Rails: None Entrance Stairs-Number of Steps: 2 to porch then two down once within the home.   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 No AD. ADLs Comments: Indepndent.     Hand Dominance   Dominant Hand: Right    Extremity/Trunk Assessment   Upper Extremity Assessment Upper Extremity Assessment: Defer to OT evaluation    Lower Extremity Assessment Lower Extremity Assessment: RLE deficits/detail RLE Deficits / Details: grossly 4/5 RLE Sensation: decreased light touch;decreased proprioception RLE Coordination: WNL    Cervical / Trunk Assessment Cervical / Trunk Assessment: Normal  Communication   Communication: No difficulties  Cognition Arousal/Alertness: Awake/alert Behavior During Therapy: WFL for tasks assessed/performed Overall Cognitive Status: Within Functional Limits for tasks assessed                                          General Comments      Exercises     Assessment/Plan    PT Assessment Patient needs continued PT services  PT Problem List Decreased strength;Decreased activity tolerance;Decreased balance;Decreased mobility       PT Treatment Interventions DME instruction;Gait training;Stair training;Functional mobility training;Therapeutic activities;Therapeutic exercise;Patient/family education;Balance training    PT Goals (Current goals can be found in the Care Plan section)  Acute Rehab PT Goals Patient Stated Goal: return home with family to assist PT Goal Formulation: With patient/family Time For Goal Achievement: 10/11/22 Potential to Achieve Goals:  Good    Frequency Min 3X/week     Co-evaluation PT/OT/SLP Co-Evaluation/Treatment: Yes Reason for Co-Treatment: To address functional/ADL transfers PT goals addressed during session: Mobility/safety with mobility;Balance;Proper use of DME         AM-PAC PT "6 Clicks" Mobility  Outcome Measure Help needed turning from your back to your side while in a flat bed without using bedrails?: None Help needed moving from lying on your back to sitting on the side of a flat bed without using bedrails?: None Help needed moving to and from a bed to a chair (including a wheelchair)?: A Little Help needed standing up from a chair using your arms (e.g., wheelchair or bedside chair)?: A Little Help needed to walk in hospital room?: A Little Help needed climbing 3-5 steps with a railing? : A Lot 6 Click Score: 19    End of Session Equipment Utilized During Treatment: Gait belt Activity Tolerance: Patient tolerated treatment well;Patient limited by fatigue Patient left: in chair;with call bell/phone within reach Nurse Communication: Mobility status PT Visit Diagnosis: Unsteadiness on feet (R26.81);Other abnormalities of gait and mobility (R26.89);Muscle weakness (generalized) (M62.81)    Time: 7425-9563 PT Time Calculation (min) (ACUTE ONLY): 21 min   Charges:   PT Evaluation $PT Eval Moderate Complexity: 1 Mod PT Treatments $Therapeutic Activity: 8-22 mins  2:56 PM, 10/08/22 Ocie Bob, MPT Physical Therapist with [facility] [phone] office [phone] mobile phone

## 2022-10-08 NOTE — Evaluation (Signed)
Occupational Therapy Evaluation Patient Details Name: Elizabeth Miranda MRN: [id number] DOB: 06/23/1950 Today's Date: 10/08/2022   History of Present Illness JAMILETH PUTZIER is a 72 y.o. female with medical history significant of hypertension, T2DM, hypothyroidism, history of intracranial hemorrhage with left occipital ICH who presents to the emergency department due to right-sided weakness and numbness which occurred around noon while sleeping.  Patient states that the sensation felt like an electric shock wave which went from her right foot to right side of face and head, she complained of headache and she states that she had similar presentation in January, though it was milder and she did not go to the ER/PCP at that time.  She states that she had a tick bite around June/July 2023 which ended being treated at that time.  She states that she had a small tick bite within the last week, unfortunately, she does not know the type of tick. (per DO)   Clinical Impression   Pt agreeable to OT and PT co-evaluation. Pt has been struggling with increased weakness in R side for several months not. Pt is still independent but with rest breaks and decreased endurance. Weakness noted in R UE compared to L as well as decreased gross motor skills and impaired sensation. Pt demonstrates good seated balance but has much postural sway and need for intermittent min A when ambulating without AD.  Pt reports ability to have 24/7 assist at home which is recommended and more so for mobility due to fall risk. Pt will benefit from continued OT in the hospital and recommended venue below to increase strength, balance, and endurance for safe ADL's.        Recommendations for follow up therapy are one component of a multi-disciplinary discharge planning process, led by the attending physician.  Recommendations may be updated based on patient status, additional functional criteria and insurance authorization.   Assistance  Recommended at Discharge Intermittent Supervision/Assistance  Patient can return home with the following A little help with walking and/or transfers;A little help with bathing/dressing/bathroom;Assistance with cooking/housework;Assist for transportation;Help with stairs or ramp for entrance    Functional Status Assessment  Patient has had a recent decline in their functional status and demonstrates the ability to make significant improvements in function in a reasonable and predictable amount of time.  Equipment Recommendations  Tub/shower seat           Precautions / Restrictions Precautions Precautions: Fall Restrictions Weight Bearing Restrictions: No      Mobility Bed Mobility Overal bed mobility: Modified Independent                  Transfers Overall transfer level: Needs assistance   Transfers: Sit to/from Stand, Bed to chair/wheelchair/BSC Sit to Stand: Min guard     Step pivot transfers: Min guard, Min assist     General transfer comment: Unsteady in standing; postural sway.      Balance Overall balance assessment: Needs assistance Sitting-balance support: No upper extremity supported, Feet supported Sitting balance-Leahy Scale: Good Sitting balance - Comments: seated EOB   Standing balance support: No upper extremity supported Standing balance-Leahy Scale: Fair Standing balance comment: Posterior sway at times; unsteady in standing without RW.                           ADL either performed or assessed with clinical judgement   ADL Overall ADL's : Needs assistance/impaired Eating/Feeding: Independent   Grooming: Set up;Sitting  Upper Body Bathing: Set up;Sitting   Lower Body Bathing: Set up;Sitting/lateral leans   Upper Body Dressing : Set up;Sitting   Lower Body Dressing: Set up;Sitting/lateral leans   Toilet Transfer: Minimal assistance;Min guard;Ambulation Toilet Transfer Details (indicate cue type and reason): Simulated  via ambulatory transfer to chair in hall from EOB. Toileting- Clothing Manipulation and Hygiene: Modified independent;Sitting/lateral lean       Functional mobility during ADLs: Min guard;Minimal assistance;Rolling walker (2 wheels) General ADL Comments: Pt ambulated ~20 feet in room and hall. RW used last 5 to 10 feet due to balance deficits.     Vision Baseline Vision/History: 1 Wears glasses Ability to See in Adequate Light: 1 Impaired Patient Visual Report: Other (comment) (Burning of eyes.) Vision Assessment?: No apparent visual deficits                Pertinent Vitals/Pain Pain Assessment Pain Assessment: 0-10 Pain Score: 8  Pain Location: head; R UE and LE Pain Descriptors / Indicators: Headache, Numbness Pain Intervention(s): Limited activity within patient's tolerance, Monitored during session, Repositioned     Hand Dominance Right   Extremity/Trunk Assessment Upper Extremity Assessment Upper Extremity Assessment: RUE deficits/detail RUE Deficits / Details: 3-/5 shoulder flexion; 3+/5 grossly other than flexion. RUE Sensation: decreased light touch RUE Coordination: decreased gross motor   Lower Extremity Assessment Lower Extremity Assessment: Defer to PT evaluation   Cervical / Trunk Assessment Cervical / Trunk Assessment: Normal   Communication Communication Communication: No difficulties   Cognition Arousal/Alertness: Awake/alert Behavior During Therapy: WFL for tasks assessed/performed Overall Cognitive Status: Within Functional Limits for tasks assessed                                                        Home Living Family/patient expects to be discharged to:: Private residence Living Arrangements: Spouse/significant other Available Help at Discharge: Family;Available 24 hours/day Type of Home: House Home Access: Stairs to enter [employer] of Steps: 2 to porch then two down once within the home. 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 No AD. ADLs Comments: Indepndent.        OT Problem List: Decreased strength;Decreased activity tolerance;Decreased range of motion;Impaired balance (sitting and/or standing);Decreased coordination;Impaired sensation      OT Treatment/Interventions: Self-care/ADL training;Therapeutic exercise;Therapeutic activities;Balance training;Patient/family education;DME and/or AE instruction    OT Goals(Current goals can be found in the care plan section) Acute Rehab OT Goals Patient Stated Goal: return home OT Goal Formulation: With patient Time For Goal Achievement: 10/22/22 Potential to Achieve Goals: Good  OT Frequency: Min 2X/week    Co-evaluation PT/OT/SLP Co-Evaluation/Treatment: Yes Reason for Co-Treatment: To address functional/ADL transfers   OT goals addressed during session: ADL's and self-care      AM-PAC OT "6 Clicks" Daily Activity     Outcome Measure Help from another person eating meals?: None Help from another person taking care of personal  grooming?: A Little Help from another person toileting, which includes using toliet, bedpan, or urinal?: A Little Help from another person bathing (including washing, rinsing, drying)?: A Little Help from another person to put on and taking off regular upper body clothing?: None Help from another person to put on and taking off regular lower body clothing?: A Little 6 Click Score: 20   End of Session Equipment Utilized During Treatment: Rolling walker (2 wheels);Gait  belt  Activity Tolerance: Patient tolerated treatment well Patient left: Other (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

## 2022-10-08 NOTE — TOC Initial Note (Signed)
Transition of Care [facility]) - Initial/Assessment Note    Patient Details  Name: Elizabeth Miranda MRN: [id number] Date of Birth: 1950-06-28  [facility]) CM/SW Contact:    Villa Herb, LCSWA Phone Number: 10/08/2022, 2:29 PM  Clinical Narrative:                 TOC updated that PT/OT are recommending HH at D/C. CSW spoke with pts daughter who is in room with pt about HH being set up. They are interested and do not have agency preference. CSW reached out to Maralyn Sago with [facility] to see if they can accept referral. TOC to follow.   Expected Discharge Plan: Home w Home Health Services Barriers to Discharge: Continued Medical Work up   Patient Goals and CMS Choice Patient states their goals for this hospitalization and ongoing recovery are:: return home CMS Medicare.gov Compare Post Acute Care list provided to:: Patient Represenative (must comment) Choice offered to / list presented to : Patient, Adult Children      Expected Discharge Plan and Services In-house Referral: Clinical Social Work Discharge Planning Services: CM Consult Post Acute Care Choice: Home Health Living arrangements for the past 2 months: Single Family Home                           HH Arranged: PT, OT          Prior Living Arrangements/Services Living arrangements for the past 2 months: Single Family Home Lives with:: Spouse Patient language and need for interpreter reviewed:: Yes Do you feel safe going back to the place where you live?: Yes      Need for Family Participation in Patient Care: Yes (Comment) Care giver support system in place?: Yes (comment)   Criminal Activity/Legal Involvement Pertinent to Current Situation/Hospitalization: No - Comment as needed  Activities of Daily Living Home Assistive Devices/Equipment: CBG Meter ADL Screening (condition at time of admission) Patient's cognitive ability adequate to safely complete daily activities?: Yes Is the patient deaf or  have difficulty hearing?: No Does the patient have difficulty seeing, even when wearing glasses/contacts?: No Does the patient have difficulty concentrating, remembering, or making decisions?: No Patient able to express need for assistance with ADLs?: Yes Does the patient have difficulty dressing or bathing?: No Independently performs ADLs?: Yes (appropriate for developmental age) Does the patient have difficulty walking or climbing stairs?: No Weakness of Legs: None Weakness of Arms/Hands: None  Permission Sought/Granted                  Emotional Assessment Appearance:: Appears stated age Attitude/Demeanor/Rapport: Engaged Affect (typically observed): Accepting Orientation: : Oriented to Self, Oriented to Place, Oriented to  Time, Oriented to Situation Alcohol / Substance Use: Not Applicable Psych Involvement: No (comment)  Admission diagnosis:  Right sided weakness [R53.1] Patient Active Problem List   Diagnosis Date Noted   Paresthesia of skin 10/08/2022   Acquired hypothyroidism 10/08/2022   Prediabetes 10/08/2022   Essential hypertension 10/08/2022   Headache 10/08/2022   PCP:  Benita Stabile, MD Pharmacy:   [facility] - [place], [place] - [address] [place] [address] Phone: [phone] Fax: [phone]  [facility] [address] [place] [address] Phone: [phone] Fax: [phone]     Social Determinants of Health (SDOH) Social History: SDOH Screenings   Food Insecurity: No Food Insecurity (10/08/2022)  Housing: Low Risk  (  10/08/2022)  Transportation Needs: No Transportation Needs (10/08/2022)  Utilities: Not At Risk (10/08/2022)  Tobacco Use: Low Risk  (10/08/2022)   SDOH Interventions:     Readmission Risk Interventions     No data to display

## 2022-10-08 NOTE — TOC Transition Note (Addendum)
Transition of Care [facility]) - CM/SW Discharge Note   Patient Details  Name: Elizabeth Miranda MRN: [id number] Date of Birth: 25-Nov-1950  [facility]) CM/SW Contact:  Elliot Gault, LCSW Phone Number: 10/08/2022, 4:19 PM   Clinical Narrative:     MD anticipating dc after MRI today. RW arranged with [employer]. [facility] will follow up with pt at home.  No other TOC needs for dc.  Pt decided she prefers outpatient PT/OT. Will refer to [facility] at pt request. Updated Sarah at [facility].  Final next level of care: Home w Home Health Services Barriers to Discharge: Barriers Resolved   Patient Goals and CMS Choice CMS Medicare.gov Compare Post Acute Care list provided to:: Patient Represenative (must comment) Choice offered to / list presented to : Patient, Adult Children  Discharge Placement                         Discharge Plan and Services Additional resources added to the After Visit Summary for   In-house Referral: Clinical Social Work Discharge Planning Services: CM Consult Post Acute Care Choice: Home Health          DME Arranged: Dan Humphreys rolling DME Agency: [facility] Date DME Agency Contacted: 10/08/22   Representative spoke with at DME Agency: Barbara Cower HH Arranged: PT, OT          Social Determinants of Health (SDOH) Interventions SDOH Screenings   Food Insecurity: No Food Insecurity (10/08/2022)  Housing: Low Risk  (10/08/2022)  Transportation Needs: No Transportation Needs (10/08/2022)  Utilities: Not At Risk (10/08/2022)  Tobacco Use: Low Risk  (10/08/2022)     Readmission Risk Interventions     No data to display

## 2022-10-08 NOTE — Care Management Obs Status (Signed)
MEDICARE OBSERVATION STATUS NOTIFICATION   Patient Details  Name: Elizabeth Miranda MRN: [id number] Date of Birth: 03-17-1951   Medicare Observation Status Notification Given:  Yes    Corey Harold 10/08/2022, 4:59 PM

## 2022-10-08 NOTE — Progress Notes (Signed)
Discharge instructions reviewed with patient and patient's daughter.  Both verbalized understanding of instructions, patient discharged home with family in stable condition.   

## 2022-10-08 NOTE — ED Notes (Signed)
Pt taken to the bathroom via wheelchair. Pt able to stand, pivot, and take a few steps getting in and out of wheelchair and bed

## 2022-10-08 NOTE — Progress Notes (Signed)
EEG complete - results pending 

## 2022-10-08 NOTE — Plan of Care (Signed)
  Problem: Acute Rehab OT Goals (only OT should resolve) Goal: Pt. Will Perform Grooming Flowsheets (Taken 10/08/2022 0945) Pt Will Perform Grooming:  with modified independence  standing Goal: Pt. Will Perform Lower Body Bathing Flowsheets (Taken 10/08/2022 0945) Pt Will Perform Lower Body Bathing:  with modified independence  Independently  sit to/from stand Goal: Pt. Will Perform Lower Body Dressing Flowsheets (Taken 10/08/2022 0945) Pt Will Perform Lower Body Dressing:  with modified independence  Independently  sitting/lateral leans  sit to/from stand Goal: Pt. Will Transfer To Toilet Flowsheets (Taken 10/08/2022 0945) Pt Will Transfer to Toilet:  Independently  with modified independence  ambulating Goal: Pt/Caregiver Will Perform Home Exercise Program Flowsheets (Taken 10/08/2022 0945) Pt/caregiver will Perform Home Exercise Program:  Increased ROM  Increased strength  Right Upper extremity  Independently  Katelynd Blauvelt OT, MOT

## 2022-10-09 ENCOUNTER — Telehealth: Payer: Self-pay | Admitting: Diagnostic Neuroimaging

## 2022-10-09 NOTE — Telephone Encounter (Signed)
Contacted daughter back, went over MD recommendations. She verbally understood and was appreciative. Has FU with cardio scheduled.

## 2022-10-09 NOTE — Telephone Encounter (Signed)
Contacted daughter back, she stated today pt started having L sided weakness. She went to ED Monday 4/22 with R sided weakness and headache. I verified that that is correct, the weakness is now on the L side. She denied sudden loss of vision, facial droop or slurred speech. MRI Brain was completed in hospital, no evidence of stroke. MRI Cervical spine was also completed and showed multi level mild spinal canal stenosis. Daughter also mentions the symptoms worsen when she lays down or lay flat. They were advised to FU with neuro. She does have an upcoming appt scheduled Monday 4/29. Do you recommend we wait until she is seen in office for eval before moving forward with other options or what do you advise?

## 2022-10-09 NOTE — Telephone Encounter (Signed)
Daughter along with pt reports that before calling pt had shaking in left arm.  Pt complained of headache at base of neck. Felt like muscle spasms on left arm.  Daughter was asked if she or pt feels the need to call 911 or the need to take pt back to ED.  Daughter confirmed with pt and she said it has passed and they are ok with waiting for a call back sometime today, they did ask message be sent as high priority.

## 2022-10-09 NOTE — Telephone Encounter (Signed)
I will see patient on 10/14/22. If worsening symptoms, then go to ER as recommended.   Also needs PCP, cardiology and pulmonology follow up.  -VRP

## 2022-10-10 ENCOUNTER — Ambulatory Visit (HOSPITAL_COMMUNITY): Payer: Medicare Other

## 2022-10-11 ENCOUNTER — Telehealth (HOSPITAL_COMMUNITY): Payer: Self-pay | Admitting: Physician Assistant

## 2022-10-11 NOTE — Telephone Encounter (Signed)
Patients daughter called and cancelled echocardiogram and did not wish to reschedule. Order will be removed from the echo WQ. Thank you

## 2022-10-11 NOTE — Procedures (Signed)
   [facility]  EEG (ELECTROENCEPHALOGRAM) REPORT   STUDY DATE: 10/03/22 PATIENT NAME: Elizabeth Miranda DOB: 02-Mar-1951 MRN: [id number]  ORDERING CLINICIAN: Joycelyn Schmid, MD   TECHNOLOGIST: Marcheta Grammes TECHNIQUE: Electroencephalogram was recorded utilizing standard 10-20 system of lead placement and reformatted into average and bipolar montages.  RECORDING TIME: 25 minutes ACTIVATION: hyperventilation and photic stimulation  CLINICAL INFORMATION: 72 year old female with   FINDINGS: Posterior dominant background rhythms, which attenuate with eye opening, ranging 10-12 hertz and 10-20 microvolts. No focal, lateralizing, epileptiform activity or seizures are seen. Patient recorded in the awake and drowsy state. EKG channel shows regular rhythm of 60-70 beats per minute.   IMPRESSION:   Normal EEG in the awake and drowsy states.     INTERPRETING PHYSICIAN:  Suanne Marker, MD Certified in Neurology, Neurophysiology and Neuroimaging  [facility] [address], [address] [place], [place] [address] [phone]

## 2022-10-14 ENCOUNTER — Ambulatory Visit: Payer: Medicare Other | Admitting: Diagnostic Neuroimaging

## 2022-10-14 ENCOUNTER — Encounter: Payer: Self-pay | Admitting: Diagnostic Neuroimaging

## 2022-10-14 ENCOUNTER — Telehealth: Payer: Self-pay | Admitting: Diagnostic Neuroimaging

## 2022-10-14 VITALS — BP 150/87 | HR 74 | Ht 63.0 in | Wt 153.0 lb

## 2022-10-14 DIAGNOSIS — R531 Weakness: Secondary | ICD-10-CM

## 2022-10-14 MED ORDER — GABAPENTIN 100 MG PO CAPS: 100.0000 mg | ORAL_CAPSULE | Freq: Every day | ORAL | 6 refills | Status: DC

## 2022-10-14 MED ORDER — NURTEC 75 MG PO TBDP
75.0000 mg | ORAL_TABLET | Freq: Every day | ORAL | 6 refills | Status: DC | PRN
Start: 2022-10-14 — End: 2023-09-02

## 2022-10-14 NOTE — Progress Notes (Signed)
[facility]  PATIENT: Elizabeth Miranda DOB: Dec 18, 1950  REFERRING CLINICIAN: Benita Stabile, MD HISTORY FROM: patient  REASON FOR VISIT: FOLLOW UP    HISTORICAL  CHIEF COMPLAINT:  Chief Complaint  Patient presents with   Follow-up    Rm 6 with daughters Neysa Bonito and Roxy Pt is well, here to follow up on TE 4/24.. Pt has R sided weakness, went to ED 4/22 MRI showed no evidence of stroke. EEG normal.     HISTORY OF PRESENT ILLNESS:   UPDATE (10/14/22, VRP): Since last visit, went ER on 10/07/22 for new onset right sided numbness / weakness. Code stroke activated.  TNK not given due to prior occipital hemorrhage. MRI, CTA, EEG were negative.  Patient discharged home.  Still has numbness, tingling sensations in her right face, right arm and right leg.  Has some increasing headaches when she lays down.  Has some discomfort in the back of her neck and head.  She is concerned about issues with her thoracic or lumbar spine.  Still with night sweats, dyspnea on exertion, shortness of breath issues.  PRIOR HPI (10/02/22): 72 year old female here for evaluation of syncope and abnormal MRI brain.  September 2023 patient started having episodes of intermittent dyspnea on exertion, lightheadedness and dizziness.  Sometimes this was associated with palpitations when laying flat on the bed.  She was having some night sweats.  Symptoms progressively worsened over time.  She followed up with cardiology and PCP. 09/26/22 she had more severe symptoms and went to emergency room for evaluation.  She was having exertional lightheadedness, diaphoresis, nausea.  She was evaluated and discharged.  She returned the next day for recurrent symptoms.  She was taken for x-ray and had a brief syncopal event.  She had CT of the head which showed no abnormalities.  MRI of the brain showed no acute findings but did show a small chronic left occipital focus of hemorrhage.  Since that time patient continues  to have generalized malaise, weakness, fatigue, intolerance to laying totally flat.  Also having some left-sided headache and left jaw pain.  No recent accidents injuries or traumas.  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   REVIEW OF SYSTEMS: Full 14 system review of systems performed and negative with exception of: as per HPI.  ALLERGIES: Allergies  Allergen Reactions   Codeine     Hives    Sulfa Antibiotics Nausea And Vomiting    HOME MEDICATIONS: Outpatient Medications Prior to Visit  Medication Sig Dispense Refill   acetaminophen (TYLENOL) 325 MG tablet Take 2 tablets (650 mg total) by mouth every 6 (six) hours as needed for mild pain, moderate pain or fever. 30 tablet 3   B Complex-C (B-COMPLEX WITH VITAMIN C) tablet Take 1 tablet by mouth daily.     cholecalciferol (VITAMIN D3) 25 MCG (1000 UNIT) tablet Take 5,000 Units by mouth daily.     ESTRADIOL PO Take 1.5 mg by mouth daily. Compound into a capsule     levothyroxine (SYNTHROID, LEVOTHROID) 100 MCG tablet Take 1 tablet by mouth daily.  4   liothyronine (CYTOMEL) 25 MCG tablet Take 25 mcg by mouth daily.     losartan (COZAAR) 25 MG tablet Take 1 tablet (25 mg total) by mouth daily. 30 tablet 0   metFORMIN (GLUCOPHAGE-XR) 500 MG 24 hr tablet Take 1 tablet (500 mg total) by mouth 2 (two) times daily. Per patient taking for PCOS 60 tablet  3   Multiple Vitamin (MULITIVITAMIN WITH MINERALS) TABS Take 1 tablet by mouth daily.     aspirin EC 81 MG tablet Take 1 tablet (81 mg total) by mouth daily with breakfast. Swallow whole. (Patient not taking: Reported on 10/14/2022) 30 tablet 11   No facility-administered medications prior to visit.    PAST MEDICAL HISTORY: Past Medical History:  Diagnosis Date   Essential hypertension 10/08/2022   Hypothyroid    Palpitations     PAST SURGICAL HISTORY: Past Surgical History:  Procedure Laterality Date   APPENDECTOMY     complete hysterectomy'      PARTIAL HYSTERECTOMY      FAMILY HISTORY: History reviewed. No pertinent family history.  SOCIAL HISTORY: Social History   Socioeconomic History   Marital status: Married    Spouse name: Not on file   Number of children: Not on file   Years of education: Not on file   Highest education level: Not on file  Occupational History   Not on file  Tobacco Use   Smoking status: Never   Smokeless tobacco: Never  Substance and Sexual Activity   Alcohol use: No   Drug use: No   Sexual activity: Not on file  Other Topics Concern   Not on file  Social History Narrative   Not on file   Social Determinants of Health   Financial Resource Strain: Not on file  Food Insecurity: No Food Insecurity (10/08/2022)   Hunger Vital Sign    Worried About Running Out of Food in the Last Year: Never true    Ran Out of Food in the Last Year: Never true  Transportation Needs: No Transportation Needs (10/08/2022)   PRAPARE - Administrator, Civil Service (Medical): No    Lack of Transportation (Non-Medical): No  Physical Activity: Not on file  Stress: Not on file  Social Connections: Not on file  Intimate Partner Violence: Not At Risk (10/08/2022)   Humiliation, Afraid, Rape, and Kick questionnaire    Fear of Current or Ex-Partner: No    Emotionally Abused: No    Physically Abused: No    Sexually Abused: No     PHYSICAL EXAM   GENERAL EXAM/CONSTITUTIONAL: Vitals:  Vitals:   10/14/22 1145  BP: (!) 150/87  Pulse: 74  Weight: 153 lb (69.4 kg)  Height: 5\' 3"  (1.6 m)   Body mass index is 27.1 kg/m. Wt Readings from Last 3 Encounters:  10/14/22 153 lb (69.4 kg)  10/07/22 153 lb (69.4 kg)  10/02/22 153 lb (69.4 kg)  No data found.  Patient is in no distress; well developed, nourished and groomed; neck is supple  CARDIOVASCULAR: Examination of carotid arteries is normal; no carotid bruits Regular rate and rhythm, no murmurs Examination of peripheral vascular system by  observation and palpation is normal  EYES: Ophthalmoscopic exam of optic discs and posterior segments is normal; no papilledema or hemorrhages No results found.  MUSCULOSKELETAL: Gait, strength, tone, movements noted in Neurologic exam below  NEUROLOGIC: MENTAL STATUS:      No data to display         awake, alert, oriented to person, place and time recent and remote memory intact normal attention and concentration language fluent, comprehension intact, naming intact fund of knowledge appropriate  CRANIAL NERVE:  2nd - no papilledema on fundoscopic exam 2nd, 3rd, 4th, 6th - pupils equal and reactive to light, visual fields full to confrontation, extraocular muscles intact, no nystagmus 5th - facial sensation -->  DECR IN RIGHT FACE 7th - facial strength symmetric 8th - hearing intact 9th - palate elevates symmetrically, uvula midline 11th - shoulder shrug symmetric 12th - tongue protrusion midline  MOTOR:  normal bulk and tone, full strength in the BUE, BLE  SENSORY:  normal and symmetric to light touch, temperature, vibration; EXCEPT DECR IN RIGHT FACE, ARM, LEG  COORDINATION:  finger-nose-finger, fine finger movements normal  REFLEXES:  deep tendon reflexes TRACE and symmetric  GAIT/STATION:  narrow based gait     DIAGNOSTIC DATA (LABS, IMAGING, TESTING) - I reviewed patient records, labs, notes, testing and imaging myself where available.  Lab Results  Component Value Date   WBC 6.4 10/08/2022   HGB 12.6 10/08/2022   HCT 37.7 10/08/2022   MCV 90.4 10/08/2022   PLT 310 10/08/2022      Component Value Date/Time   NA 136 10/08/2022 0357   NA 140 08/27/2022 1508   K 3.6 10/08/2022 0357   CL 104 10/08/2022 0357   CO2 23 10/08/2022 0357   GLUCOSE 88 10/08/2022 0357   BUN 15 10/08/2022 0357   BUN 19 08/27/2022 1508   CREATININE 0.68 10/08/2022 0357   CALCIUM 8.8 (L) 10/08/2022 0357   PROT 7.0 10/08/2022 0357   PROT 7.8 05/27/2022 1611   ALBUMIN  3.8 10/08/2022 0357   ALBUMIN 4.8 05/27/2022 1611   AST 23 10/08/2022 0357   ALT 19 10/08/2022 0357   ALKPHOS 56 10/08/2022 0357   BILITOT 0.6 10/08/2022 0357   BILITOT 0.2 05/27/2022 1611   GFRNONAA >60 10/08/2022 0357   GFRAA >60 07/15/2019 0403   Lab Results  Component Value Date   CHOL 187 10/07/2022   HDL 42 10/07/2022   LDLCALC 99 10/07/2022   TRIG 229 (H) 10/07/2022   CHOLHDL 4.5 10/07/2022   Lab Results  Component Value Date   HGBA1C 5.7 (H) 10/07/2022   Lab Results  Component Value Date   VITAMINB12 1,149 10/02/2022   Lab Results  Component Value Date   TSH 1.600 05/27/2022    09/27/22 MRI brain wo [I reviewed images myself and agree with interpretation. -VRP]  1. No evidence of acute intracranial abnormality. 2. Prior left occipital hemorrhage.  10/07/22 MRI brain - No acute intracranial process. No evidence of acute or subacute infarct.  10/08/22 MRI cervical spine 1. C5-C6 mild spinal canal stenosis and mild right neural foraminal narrowing. 2. C6-C7 mild spinal canal stenosis and mild bilateral neural foraminal narrowing. 3. C3-C4 mild left neural foraminal narrowing. 4. No abnormal spinal cord signal or enhancement.  10/07/22 CTA head / neck 1. No intracranial large vessel occlusion or significant stenosis. 2. Mild to moderate stenosis in the distal right V2/proximal V3. No other hemodynamically significant stenosis in the neck. 3. No evidence of infarct core or penumbra on CT perfusion.  10/03/22 EEG - Normal EEG in the awake and drowsy states.   10/08/22 EEG -This study is within normal limits. No seizures or epileptiform discharges were seen throughout the recording.     ASSESSMENT AND PLAN  72 y.o. year old female here with:   Dx:  1. Weakness     PLAN:  DYSPNEA ON EXERTION, LIGHTHEADEDNESS, NIGHT SWEATS, DIZZINESS, BP FLUCTUTATIONS, SYNCOPE (09/27/22); other symptoms since ~Sept 2023  - follow up with PCP, cardiology; consider  pulmonology  - According to [facility] law, you can not drive unless you are seizure / syncope free for at least 6 months and under physician's care (although patient had not eaten for a  while that day)  - Please maintain precautions. Do not participate in activities where a loss of awareness could harm you or someone else. No swimming alone, no tub bathing, no hot tubs, no driving, no operating motorized vehicles (cars, ATVs, motocycles, etc), lawnmowers, power tools or firearms. No standing at heights, such as rooftops, ladders or stairs. Avoid hot objects such as stoves, heaters, open fires. Wear a helmet when riding a bicycle, scooter, skateboard, etc. and avoid areas of traffic. Set your water heater to 120 degrees or less.   POSSIBLE COMPLICATED MIGRAINE (persistent right sided numbness; MRI brain negative; headaches with migraine features) - trial of gabapentin 100mg  at bedtime - nurtec as needed for breakthrough   LEFT OCCIPITAL HEMORRHAGIC LESION (cavernous malformation, dystrophic mineralization, prior intracerebral hemorrhage) - likely incidental finding; monitor  Orders Placed This Encounter  Procedures   MR THORACIC SPINE W WO CONTRAST   MR LUMBAR SPINE W WO CONTRAST   ANA,IFA RA Diag Pnl w/rflx Tit/Patn   AChR Abs with Reflex to MuSK   Autoimmune Neurology Ab   Meds ordered this encounter  Medications   Rimegepant Sulfate (NURTEC) 75 MG TBDP    Sig: Take 1 tablet (75 mg total) by mouth daily as needed.    Dispense:  8 tablet    Refill:  6   gabapentin (NEURONTIN) 100 MG capsule    Sig: Take 1 capsule (100 mg total) by mouth at bedtime.    Dispense:  30 capsule    Refill:  6   Return in 6 months (on 04/15/2023) for pending if symptoms worsen or fail to improve.   I spent 60 minutes of face-to-face and non-face-to-face time with patient.  This included previsit chart review, lab review, study review, order entry, electronic health record documentation, patient education.      Suanne Marker, MD 10/14/2022, 11:56 AM Certified in Neurology, Neurophysiology and Neuroimaging  [facility] [address] [place], [place] [address] [phone]

## 2022-10-14 NOTE — Telephone Encounter (Signed)
[employer] medicare NPR sent to GI [phone] 

## 2022-10-14 NOTE — Patient Instructions (Addendum)
POSSIBLE COMPLICATED MIGRAINE (right sided numbness, pain; HA) - trial of gabapentin 100mg  at bedtime - nurtec as needed for breakthrough  - CHECK LABS, MRI THORACIC, LUMBAR SPINE   DYSPNEA ON EXERTION, LIGHTHEADEDNESS, NIGHT SWEATS, DIZZINESS, BP FLUCTUTATIONS, SYNCOPE (09/27/22); other symptoms since ~Sept 2023  - follow up with PCP, cardiology; consider pulmonology  - According to [place] law, you can not drive unless you are seizure / syncope free for at least 6 months and under physician's care (although patient had not eaten for a while that day)  - Please maintain precautions. Do not participate in activities where a loss of awareness could harm you or someone else. No swimming alone, no tub bathing, no hot tubs, no driving, no operating motorized vehicles (cars, ATVs, motocycles, etc), lawnmowers, power tools or firearms. No standing at heights, such as rooftops, ladders or stairs. Avoid hot objects such as stoves, heaters, open fires. Wear a helmet when riding a bicycle, scooter, skateboard, etc. and avoid areas of traffic. Set your water heater to 120 degrees or less.

## 2022-10-15 LAB — AUTOIMMUNE NEUROLOGY AB
AMPA-R2 Antibody: NEGATIVE
Anti-Hu Ab: NEGATIVE
Ma2/Ta Antibody: NEGATIVE
Purkinje Cell Cyto Ab Type 2: NEGATIVE

## 2022-10-15 NOTE — Telephone Encounter (Signed)
Pt is scheduled to see Dr. Shari Prows for this Thursday 5/2 at 0900.  Pt and family made aware of appt date and time.

## 2022-10-15 NOTE — Progress Notes (Unsigned)
Cardiology Office Note:   Date:  10/17/2022  ID:  Elizabeth Miranda, DOB January 18, 1951, MRN [id number]  History of Present Illness:   Elizabeth Miranda is a 72 y.o. female hypothyroidism, HTN, history of left occipital ICH and atrial tachycardia who presents to clinic for follow-up.  Per review of the record, patient has history of chest pain with coronary CTA with 08/2022 with normal coronaires and Ca score 0. Zio 06/2022 for palpitations showed rare atrial tach. TTE 05/2022 with LVEF 60-65%, G1DD, trivial MR.  Patient seen in ER 09/26/22 with SOB, dizziness and weakness. Trop negative, BNP normal. Returned to ER on 09/27/22 with chest pain and episode of syncope during CXR. MRI head with incidental finding of a small, chronic left occipital hemorrhage lesion (cavernous malformation, dystrophic mineralization, prior intracerebral hemorrhage). Was evaluated by Neurology and recommended for EEG.  Admitted 09/2022 with right sided weakness and numbness. CT head negative. CTA head and neck with no significant occlusion or stenosis. MRI brain without acute process. EEG with no acute findings.   Today, the patient states that about 2 weeks ago she felt about a shock when up her body with associated right sided weakness and HA. Had similar episodes in 06/2022 but did not seek medical attention at that time. Since her last episode, her numbness has persisted. She followed up with Neurology and is planned for MRI of the lumbar/sacral spine. Also states that symptoms can be triggered by lifting her right arm. We discussed that this is unlikely to be related to her heart but agreed with continued neuropathic work-up.  She continues to have significant dyspnea on exertion especially when climbing stairs/inclines. She can also have intermittent chest heaviness but this has improved. Has noticed mild LE edema but admits she is sitting much more. No orthopnea or PND. We discussed her coronary CTA, cardiac monitor and TTE  at length.   Past Medical History:  Diagnosis Date   Essential hypertension 10/08/2022   Hypothyroid    Palpitations      ROS: As per HPI  Studies Reviewed:    EKG:  NSR, HR 76-Personally reviewed  Cardiac Studies & Procedures       ECHOCARDIOGRAM  ECHOCARDIOGRAM COMPLETE 05/28/2022  Narrative ECHOCARDIOGRAM REPORT    Patient Name:   Elizabeth Miranda Date of Exam: 05/28/2022 Medical Rec #:  [id number]         Height:       60.5 in Accession #:    [id number]        Weight:       151.2 lb Date of Birth:  [birth year]         BSA:          1.668 m Patient Age:    71 years          BP:           130/85 mmHg Patient Gender: F                 HR:           60 bpm. Exam Location:  [address]  Procedure: 2D Echo, Color Doppler, Cardiac Doppler, 3D Echo and Strain Analysis  Indications:    Elevated Troponin; R06.9 DOE; R00.2 Palpitations  History:        Patient has no prior history of Echocardiogram examinations.  Sonographer:    Thurman Coyer RDCS Referring Phys: [phone] PHILIP J NAHSER  IMPRESSIONS   1. Left ventricular ejection fraction, by estimation, is  60 to 65%. The left ventricle has normal function. The left ventricle has no regional wall motion abnormalities. Left ventricular diastolic parameters are consistent with Grade I diastolic dysfunction (impaired relaxation). The average left ventricular global longitudinal strain is -20.6 %. The global longitudinal strain is normal. 2. Right ventricular systolic function is normal. The right ventricular size is normal. 3. The mitral valve is normal in structure. Trivial mitral valve regurgitation. No evidence of mitral stenosis. 4. The aortic valve is normal in structure. Aortic valve regurgitation is not visualized. No aortic stenosis is present. 5. The inferior vena cava is normal in size with greater than 50% respiratory variability, suggesting right atrial pressure of 3 mmHg.  FINDINGS Left Ventricle: Left  ventricular ejection fraction, by estimation, is 60 to 65%. The left ventricle has normal function. The left ventricle has no regional wall motion abnormalities. The average left ventricular global longitudinal strain is -20.6 %. The global longitudinal strain is normal. The left ventricular internal cavity size was normal in size. There is no left ventricular hypertrophy. Left ventricular diastolic parameters are consistent with Grade I diastolic dysfunction (impaired relaxation).  Right Ventricle: The right ventricular size is normal. No increase in right ventricular wall thickness. Right ventricular systolic function is normal.  Left Atrium: Left atrial size was normal in size.  Right Atrium: Right atrial size was normal in size.  Pericardium: There is no evidence of pericardial effusion.  Mitral Valve: The mitral valve is normal in structure. Trivial mitral valve regurgitation. No evidence of mitral valve stenosis.  Tricuspid Valve: The tricuspid valve is normal in structure. Tricuspid valve regurgitation is not demonstrated. No evidence of tricuspid stenosis.  Aortic Valve: The aortic valve is normal in structure. Aortic valve regurgitation is not visualized. No aortic stenosis is present.  Pulmonic Valve: The pulmonic valve was normal in structure. Pulmonic valve regurgitation is not visualized. No evidence of pulmonic stenosis.  Aorta: The aortic root is normal in size and structure.  Venous: The inferior vena cava is normal in size with greater than 50% respiratory variability, suggesting right atrial pressure of 3 mmHg.  IAS/Shunts: No atrial level shunt detected by color flow Doppler.   LEFT VENTRICLE PLAX 2D LVIDd:         3.80 cm   Diastology LVIDs:         2.50 cm   LV e' medial:    5.55 cm/s LV PW:         0.80 cm   LV E/e' medial:  13.7 LV IVS:        0.60 cm   LV e' lateral:   9.25 cm/s LVOT diam:     1.90 cm   LV E/e' lateral: 8.2 LV SV:         64 LV SV Index:    38        2D Longitudinal Strain LVOT Area:     2.84 cm  2D Strain GLS (A2C):   -20.9 % 2D Strain GLS (A3C):   -20.4 % 2D Strain GLS (A4C):   -20.7 % 2D Strain GLS Avg:     -20.6 %  3D Volume EF: 3D EF:        60 % LV EDV:       88 ml LV ESV:       35 ml LV SV:        53 ml  RIGHT VENTRICLE             IVC RV  Basal diam:  3.00 cm     IVC diam: 1.20 cm RV Mid diam:    2.30 cm RV S prime:     11.90 cm/s TAPSE (M-mode): 2.1 cm  LEFT ATRIUM             Index        RIGHT ATRIUM          Index LA diam:        2.50 cm 1.50 cm/m   RA Area:     8.50 cm LA Vol (A2C):   40.7 ml 24.41 ml/m  RA Volume:   13.00 ml 7.80 ml/m LA Vol (A4C):   26.2 ml 15.71 ml/m LA Biplane Vol: 35.1 ml 21.05 ml/m AORTIC VALVE LVOT Vmax:   97.50 cm/s LVOT Vmean:  67.600 cm/s LVOT VTI:    0.225 m  AORTA Ao Root diam: 2.50 cm Ao Asc diam:  3.20 cm  MITRAL VALVE MV Area (PHT): 3.17 cm    SHUNTS MV Decel Time: 239 msec    Systemic VTI:  0.22 m MV E velocity: 76.00 cm/s  Systemic Diam: 1.90 cm MV A velocity: 88.00 cm/s MV E/A ratio:  0.86  Chilton Si MD Electronically signed by Chilton Si MD Signature Date/Time: 05/28/2022/3:37:21 PM    Final    MONITORS  LONG TERM MONITOR (3-14 DAYS) 06/19/2022  Narrative   Predominate rhythm is sinus rhythm including NSR, sinus bradycardia and sinus tachycardia   Rare and very brief episodes of atrial tachycardia .   Rare Premature ventricular contractions   The patient triggers were associated with sinus rhythm .   No evidence of atrial fib   No pauses   Patch Wear Time:  13 days and 23 hours (2023-12-11T16:06:04-499 to 2023-12-25T16:06:08-498)  Patient had a min HR of 54 bpm, max HR of 143 bpm, and avg HR of 79 bpm. Predominant underlying rhythm was Sinus Rhythm. 4 Supraventricular Tachycardia runs occurred, the run with the fastest interval lasting 6 beats with a max rate of 143 bpm, the longest lasting 25.9 secs with an avg rate of 98  bpm. Ectopic Atrial Rhythm was present. Ectopic Atrial Rhythm was detected within +/- 45 seconds of symptomatic patient event(s). Isolated SVEs were rare (<1.0%), SVE Couplets were rare (<1.0%), and no SVE Triplets were present. Isolated VEs were rare (<1.0%), VE Couplets were rare (<1.0%), and no VE Triplets were present. Ventricular Trigeminy was present. Difficulty discerning atrial activity during periods of high heart rate, making definitive diagnosis between Sinus Tachycardia and Supraventricular Tachycardia difficult to ascertain.   CT SCANS  CT CORONARY MORPH W/CTA COR W/SCORE 09/04/2022  Addendum 09/04/2022 11:13 AM ADDENDUM REPORT: 09/04/2022 11:11  EXAM: OVER-READ INTERPRETATION  CT CHEST  The following report is an over-read performed by radiologist Dr. Karle Barr [facility] on 09/04/2022. This over-read does not include interpretation of cardiac or coronary anatomy or pathology. The coronary CTA interpretation by the cardiologist is attached.  COMPARISON:  None.  FINDINGS: Aorta normal caliber without aneurysm or dissection. No pericardial effusion. Visualized pulmonary arteries patent. Esophagus unremarkable. No adenopathy. BILATERAL breast prostheses. Visualized upper abdomen normal. Lungs clear. Bone island at a lower thoracic vertebra proximally T11. No acute osseous findings.  IMPRESSION: No significant extracardiac abnormalities.   Electronically Signed By: Ulyses Southward M.D. On: 09/04/2022 11:11  Narrative CLINICAL DATA:  Chest pain  EXAM: Cardiac CTA  MEDICATIONS: Sub lingual nitro. 4mg  and lopressor 100mg   TECHNIQUE: The patient was scanned on a [employer]  192 slice scanner. Gantry rotation speed was 250 msecs. Collimation was .6 mm. A 70 kV prospective scan was triggered in the ascending thoracic aorta at 140 HU's Full mA was used between 35% and 75% of the R-R interval. Average HR during the scan was 68 bpm. The 3D data set  was interpreted on a dedicated work station using MPR, MIP and VRT modes. A total of 80cc of contrast was used.  FINDINGS: Non-cardiac: See separate report from [facility]. No significant findings on limited lung and soft tissue windows.  Calcium Score: No calcium noted  Coronary Arteries: Right dominant with no anomalies  LM: Normal  LAD: Normal  IM: Normal  D1: Normal  Circumflex: Normal  OM1: Normal  OM2: Normal  RCA: Normal  PDA: Normal  PLA: Normal  IMPRESSION: 1.  Calcium score 0  2.  Normal right dominant coronary arteries  3.  Normal ascending thoracic aorta 3.1 cm  Charlton Haws  Electronically Signed: By: Charlton Haws M.D. On: 09/02/2022 14:07           Risk Assessment/Calculations:              Physical Exam:   VS:  BP 126/84   Pulse 76   Ht 5\' 3"  (1.6 m)   Wt 154 lb 6.4 oz (70 kg)   SpO2 98%   BMI 27.35 kg/m    Wt Readings from Last 3 Encounters:  10/17/22 154 lb 6.4 oz (70 kg)  10/14/22 153 lb (69.4 kg)  10/07/22 153 lb (69.4 kg)     GEN: Well nourished, well developed in no acute distress NECK: No JVD; No carotid bruits CARDIAC: RRR, 2/6 systolic murmur RESPIRATORY:  Clear to auscultation without rales, wheezing or rhonchi  ABDOMEN: Soft, non-tender, non-distended EXTREMITIES:  No edema; No deformity   ASSESSMENT AND PLAN:   #DOE: #Chest Pain: -Unclear etiology with reassuring cardiac work-up; possibly some element of microvascular angina -Coronary CTA with normal coronary arteries with Ca score 0 in 08/2022 -TTE with LVEF 60-65%, normal strain. G1DD, trivial MR -Will trial low dose imdur to see if helps with symptoms; if not, can refer to Pulm  #History of Small, Chronic Occipital ICH: #Right sided paresthesias: -Incidentally found to have small, chronic ICH on MRI head not thought to be a driver of symptoms -Had recent admission for parethesias with reassuring CT head, MRI brain and CTA head and neck -No  Afib on cardiac monitor in 06/2022 -Following with Neurology and is planned for further spine MRI imaging -Will refer to Dr. Katrinka Blazing for concern for neuropathic pain  #HTN with episodes of orthostasis: -Appears to be having episodes of orthostasis at home -Will hold losartan and change to imdur for possible microvascular angina as above -Discussed compression socks, hydration and slow position changes  #Palpitations: -Cardiac monitor with brief episode of atach -Overall symptoms improved and not on nodal agents -Prefer natural remedies if possible          Signed, [name], MD

## 2022-10-16 ENCOUNTER — Telehealth: Payer: Self-pay | Admitting: Neurology

## 2022-10-16 LAB — AUTOIMMUNE NEUROLOGY AB
AGNA-1: NEGATIVE
Aquaporin 4 Antibody: NEGATIVE
CASPR2 Antibody,Cell-based IFA: NEGATIVE
CRMP-5 IgG: NEGATIVE
IgLON5 Antibody: NEGATIVE

## 2022-10-16 LAB — ANA,IFA RA DIAG PNL W/RFLX TIT/PATN: ANA Titer 1: POSITIVE — AB

## 2022-10-16 NOTE — Telephone Encounter (Signed)
PA completed on CMM/optumrx ZOX:[id number] Will await determination

## 2022-10-16 NOTE — Telephone Encounter (Signed)
PA approved Request Reference Number: [id number]. NURTEC TAB 75MG  ODT is approved through 06/17/2023.

## 2022-10-17 ENCOUNTER — Telehealth: Payer: Self-pay

## 2022-10-17 ENCOUNTER — Telehealth: Payer: Self-pay | Admitting: Diagnostic Neuroimaging

## 2022-10-17 ENCOUNTER — Encounter: Payer: Self-pay | Admitting: Cardiology

## 2022-10-17 ENCOUNTER — Ambulatory Visit: Payer: Medicare Other | Attending: Cardiology | Admitting: Cardiology

## 2022-10-17 VITALS — BP 126/84 | HR 76 | Ht 63.0 in | Wt 154.4 lb

## 2022-10-17 DIAGNOSIS — M792 Neuralgia and neuritis, unspecified: Secondary | ICD-10-CM | POA: Diagnosis not present

## 2022-10-17 DIAGNOSIS — I2510 Atherosclerotic heart disease of native coronary artery without angina pectoris: Secondary | ICD-10-CM | POA: Diagnosis not present

## 2022-10-17 DIAGNOSIS — R0609 Other forms of dyspnea: Secondary | ICD-10-CM | POA: Diagnosis not present

## 2022-10-17 DIAGNOSIS — R002 Palpitations: Secondary | ICD-10-CM

## 2022-10-17 DIAGNOSIS — R768 Other specified abnormal immunological findings in serum: Secondary | ICD-10-CM

## 2022-10-17 DIAGNOSIS — R0600 Dyspnea, unspecified: Secondary | ICD-10-CM

## 2022-10-17 DIAGNOSIS — R079 Chest pain, unspecified: Secondary | ICD-10-CM

## 2022-10-17 MED ORDER — ISOSORBIDE MONONITRATE ER 30 MG PO TB24
15.0000 mg | ORAL_TABLET | Freq: Every day | ORAL | 3 refills | Status: DC
Start: 2022-10-17 — End: 2023-09-02

## 2022-10-17 NOTE — Telephone Encounter (Signed)
-----   Message from Suanne Marker, MD sent at 10/16/2022  3:34 PM EDT ----- ANA positive; recommend to follow up with rheumatology. -VRP

## 2022-10-17 NOTE — Patient Instructions (Signed)
Medication Instructions:   STOP TAKING LOSARTAN NOW  START TAKING ISOSORBIDE MONONITRATE (IMDUR) 15 MG BY MOUTH DAILY  *If you need a refill on your cardiac medications before your next appointment, please call your pharmacy*    You have been referred to [facility] TO SEE Elizabeth Miranda FOR NEUROPATHIC PAIN    Follow-Up:  3 MONTHS WITH AN EXTENDER IN THE OFFICE

## 2022-10-17 NOTE — Telephone Encounter (Signed)
Referral sent to [facility]: Phone: [phone] Fax: [phone]

## 2022-10-17 NOTE — Telephone Encounter (Signed)
Called and spoke with patient to inform her labs ANA positive and Dr. Dierdre Highman recommend to follow up with rheumatology. Patient agreed on seen a Rheumatology, Pt verbalized understanding and Pt had no questions at this time but was encouraged to call back if questions arise.  referral has been placed.Elizabeth Miranda

## 2022-10-18 ENCOUNTER — Telehealth (HOSPITAL_COMMUNITY): Payer: Self-pay | Admitting: Physician Assistant

## 2022-10-18 ENCOUNTER — Ambulatory Visit (HOSPITAL_COMMUNITY): Payer: Medicare Other

## 2022-10-18 NOTE — Telephone Encounter (Signed)
I called to reschedule patients Myoview and she states she does not need anymore after hospitalization. Order will be removed from the [facility]. Thank you.

## 2022-10-18 NOTE — Telephone Encounter (Signed)
Eugenie Birks is not needed at this time.  See recent OV note with Dr. Shari Prows for further details.

## 2022-10-21 DIAGNOSIS — R0789 Other chest pain: Secondary | ICD-10-CM | POA: Insufficient documentation

## 2022-10-21 DIAGNOSIS — W57XXXD Bitten or stung by nonvenomous insect and other nonvenomous arthropods, subsequent encounter: Secondary | ICD-10-CM | POA: Diagnosis not present

## 2022-10-21 DIAGNOSIS — G43009 Migraine without aura, not intractable, without status migrainosus: Secondary | ICD-10-CM | POA: Insufficient documentation

## 2022-10-21 DIAGNOSIS — I10 Essential (primary) hypertension: Secondary | ICD-10-CM | POA: Diagnosis not present

## 2022-10-21 DIAGNOSIS — R002 Palpitations: Secondary | ICD-10-CM | POA: Diagnosis not present

## 2022-10-21 DIAGNOSIS — I629 Nontraumatic intracranial hemorrhage, unspecified: Secondary | ICD-10-CM | POA: Diagnosis not present

## 2022-10-21 DIAGNOSIS — E039 Hypothyroidism, unspecified: Secondary | ICD-10-CM | POA: Diagnosis not present

## 2022-10-21 DIAGNOSIS — R202 Paresthesia of skin: Secondary | ICD-10-CM | POA: Diagnosis not present

## 2022-10-21 DIAGNOSIS — R0609 Other forms of dyspnea: Secondary | ICD-10-CM | POA: Diagnosis not present

## 2022-10-21 LAB — MUSK ANTIBODIES

## 2022-10-21 LAB — AUTOIMMUNE NEUROLOGY AB
DNER Antibody: NEGATIVE
DPPX Antibody: NEGATIVE
GAD65 Antibody: NEGATIVE
Interpretation: NEGATIVE
LGI1 Antibody, Cell-based IFA: NEGATIVE
NMDA-R Antibody: NEGATIVE
Zic4 Antibody: NEGATIVE

## 2022-10-25 LAB — AUTOIMMUNE NEUROLOGY AB
AMPA-R1 Antibody: NEGATIVE
Amphiphysin Antibody: NEGATIVE
Anti-Ri Ab: NEGATIVE
Anti-Yo Ab: NEGATIVE
Antineruonal nuclear Ab Type 3: NEGATIVE
GABA-B-R Antibody: NEGATIVE
ITPR1 Antibody: NEGATIVE
Purkinje Cell Cyto Ab Type Tr: NEGATIVE
mGluR1 Antibody: NEGATIVE

## 2022-10-25 LAB — ANA,IFA RA DIAG PNL W/RFLX TIT/PATN
Cyclic Citrullin Peptide Ab: 8 units (ref 0–19)
Rheumatoid fact SerPl-aCnc: <10 IU/mL (ref <14.0)

## 2022-10-25 LAB — FANA STAINING PATTERNS: Homogeneous Pattern: 1:160 {titer} — ABNORMAL HIGH

## 2022-10-25 LAB — ACHR ABS WITH REFLEX TO MUSK: AChR Binding Ab, Serum: <0.03 nmol/L (ref 0.00–0.24)

## 2022-10-28 DIAGNOSIS — E559 Vitamin D deficiency, unspecified: Secondary | ICD-10-CM | POA: Diagnosis not present

## 2022-10-28 DIAGNOSIS — E611 Iron deficiency: Secondary | ICD-10-CM | POA: Diagnosis not present

## 2022-10-28 DIAGNOSIS — R7309 Other abnormal glucose: Secondary | ICD-10-CM | POA: Diagnosis not present

## 2022-10-28 DIAGNOSIS — E063 Autoimmune thyroiditis: Secondary | ICD-10-CM | POA: Diagnosis not present

## 2022-10-28 DIAGNOSIS — M10059 Idiopathic gout, unspecified hip: Secondary | ICD-10-CM | POA: Diagnosis not present

## 2022-10-28 DIAGNOSIS — E782 Mixed hyperlipidemia: Secondary | ICD-10-CM | POA: Diagnosis not present

## 2022-11-07 ENCOUNTER — Encounter: Payer: Self-pay | Admitting: Internal Medicine

## 2022-11-07 ENCOUNTER — Ambulatory Visit: Payer: Medicare Other | Admitting: Internal Medicine

## 2022-11-07 VITALS — BP 110/84 | HR 79 | Temp 98.1°F | Ht 63.0 in | Wt 151.8 lb

## 2022-11-07 DIAGNOSIS — R0609 Other forms of dyspnea: Secondary | ICD-10-CM | POA: Diagnosis not present

## 2022-11-07 DIAGNOSIS — R002 Palpitations: Secondary | ICD-10-CM

## 2022-11-07 DIAGNOSIS — G901 Familial dysautonomia [Riley-Day]: Secondary | ICD-10-CM

## 2022-11-07 NOTE — Progress Notes (Signed)
CAYLEEN YOUN    [id number]    11-25-1950  Primary Care Physician:Hall, Kathleene Hazel, MD  Referring Physician: Benita Stabile, MD [address] [place],  [place] [address] Reason for Consultation: shortness of breath Date of Consultation: 11/07/2022  Chief complaint:   Chief Complaint  Patient presents with   Consult    Sob with exertion and bending over.  Problem since January.  No sob with sitting.  No coughing.  April she had stroke like symptoms on right side of body.  She is seeing cardiology and neurology.     HPI:  Elizabeth Miranda is a 72 y.o. woman who presents for new patient evaluation of dyspnea.   Had new onset seizure like symptoms in January. Was standing at the kitchen sink.   Symptoms start at the base of her legs, goes up her arms, and starts a headache. Severe headache, that she has to sit down and wait it out. Recurrent symptoms in April. Felt to have TIA type symptoms.  Has had paresthesias across her face. Has tried migraine medicine without relief. Due to have spine MR next week.   Symptoms of shortness of breath started with these episodes. She has seen cardiology and been given nitroglycerin.  Symptoms come with going up stairs, walking in from the parking lot. Has to go very slowly and avoids walking long distances. Denies cough, chest tightness, wheezing. Feels it's hard to get a deep breath in, no pain.   She has a puls oximeter and her saturations have gone to 96%.   She has also had symptoms with sleep - wakes up with palpitations and chills, shortness of breath, weakness in her legs and whole body.  These wake her up from sleep. She denies snoring. She denies excessive daytime sleepiness, or waking up feeling unrefreshed. Denies apneas.   Has never had symptoms like this before. Had lyme disease treated with doxycycline for three weeks by a nurse pracitioner in [place]. "Brian brown, [employer]) Also given steroids.   In 2023.  Had a lower respiratory tract infection - and had a home test. Was not treated with anti-viral medication. Was treated with augmentin.   Has been checking blood pressure 108/65 HR jumps from 85-115 at rest. Symptoms are almost always coming with fast heart rate.    Social history:  Occupation: worked in Paramedic based jobs.  Exposures: lives at home with husband.  Smoking history: never smoked  Social History   Occupational History   Not on file  Tobacco Use   Smoking status: Never   Smokeless tobacco: Never  Substance and Sexual Activity   Alcohol use: No   Drug use: No   Sexual activity: Not on file    Relevant family history:  Family History  Problem Relation Age of Onset   Emphysema Paternal Grandfather     Past Medical History:  Diagnosis Date   Essential hypertension 10/08/2022   Hypothyroid    Palpitations     Past Surgical History:  Procedure Laterality Date   APPENDECTOMY     BREAST ENHANCEMENT SURGERY     complete hysterectomy'     PARTIAL HYSTERECTOMY      Physical Exam: Blood pressure 110/84, pulse 79, temperature 98.1 F (36.7 C), temperature source Oral, height 5\' 3"  (1.6 m), weight 151 lb 12.8 oz (68.9 kg), SpO2 100 %. Gen:      No acute distress ENT:  no nasal polyps,  mucus membranes moist Lungs:    No increased respiratory effort, symmetric chest wall excursion, clear to auscultation bilaterally, no wheezes or crackles CV:         Regular rate and rhythm; no murmurs, rubs, or gallops.  No pedal edema Abd:      + bowel sounds; soft, non-tender; no distension MSK: no acute synovitis of DIP or PIP joints, no mechanics hands.  Skin:      Warm and dry; no rashes Neuro: normal speech, no focal facial asymmetry Psych: alert and oriented x3, normal mood and affect   Data Reviewed/Medical Decision Making:  Independent interpretation of tests: Imaging:  Review of patient's chest xray images April 2024 revealed no acute process. The patient's images  have been independently reviewed by me.    April 2024 cervical spine 1. C5-C6 mild spinal canal stenosis and mild right neural foraminal narrowing. 2. C6-C7 mild spinal canal stenosis and mild bilateral neural foraminal narrowing. 3. C3-C4 mild left neural foraminal narrowing. 4. No abnormal spinal cord signal or enhancement.  MRI brain April 2024 No acute intracranial process. No evidence of acute or subacute infarct.   PFTs:  Labs:  Lab Results  Component Value Date   NA 136 10/08/2022   K 3.6 10/08/2022   CO2 23 10/08/2022   GLUCOSE 88 10/08/2022   BUN 15 10/08/2022   CREATININE 0.68 10/08/2022   CALCIUM 8.8 (L) 10/08/2022   EGFR 93 08/27/2022   GFRNONAA >60 10/08/2022   Lab Results  Component Value Date   WBC 6.4 10/08/2022   HGB 12.6 10/08/2022   HCT 37.7 10/08/2022   MCV 90.4 10/08/2022   PLT 310 10/08/2022     Immunization status:   There is no immunization history on file for this patient.   I reviewed prior external note(s) from cardiology, pcp, neurology  I reviewed the result(s) of the labs and imaging as noted above.   I have ordered pft  Discussion of management or test interpretation with another colleague .   Assessment:  Shortness of breath Palpitations Headaches   Plan/Recommendations:  I think it is very unlikely that your symptoms are related to a pulmonary condition. I do suspect you could have a form of dysautonomia.   I will order some breathing testing to rule out pulmonary conditions and will contact you with the results.   Here is a reliable website for some more information. I will also reach out to Dr. Shari Prows.  Given her some reliable resources regarding dysautonomia and patient information.   We discussed disease management and progression at length today.   I spent 45 minutes in the care of this patient today including pre-charting, chart review, review of results, face-to-face care, coordination of care and  communication with consultants etc.).   Return to Care: Return if symptoms worsen or fail to improve.  Durel Salts, MD Pulmonary and Critical Care Medicine [facility] Office:[phone]  CC: Benita Stabile, MD

## 2022-11-07 NOTE — Patient Instructions (Addendum)
Please follow up with me as needed.  I will order some breathing testing to rule out pulmonary conditions and will contact you with the results.   I think it is very unlikely that your symptoms are related to a pulmonary condition. I do suspect you could have a form of dysautonomia.   Here is a reliable website for some more information. I will also reach out to Dr. Shari Prows to hear her thoughts on this.   [email or web address]  [email or web address]

## 2022-11-12 ENCOUNTER — Ambulatory Visit
Admission: RE | Admit: 2022-11-12 | Discharge: 2022-11-12 | Disposition: A | Discharge status: Home / Self Care | Payer: Medicare Other | Source: Ambulatory Visit | Attending: Diagnostic Neuroimaging | Admitting: Diagnostic Neuroimaging

## 2022-11-12 DIAGNOSIS — R531 Weakness: Secondary | ICD-10-CM | POA: Diagnosis not present

## 2022-11-12 MED ORDER — GADOPICLENOL 0.5 MMOL/ML IV SOLN
6.5000 mL | Freq: Once | INTRAVENOUS | Status: AC | PRN
  Administered 2022-11-12: 6.5 mL via INTRAVENOUS

## 2022-11-14 NOTE — Progress Notes (Signed)
[facility] [address] [place] [address] Phone: [phone] Subjective:   [name], am serving as a scribe for Dr. Antoine Primas.  I'm seeing this patient by the request  of:  Benita Stabile, MD  CC: Neuropathic pain  [id number]  Elizabeth [facility] is a 72 y.o. female coming in with complaint of neuropathic pain. Patient states that her pain began in January. Patient feels a burning from her feet up her back and into her head. Pain occurring when sleeping. Pain is present throughout the day but much less than at night. Using mm relaxer at night. Patient had been worked up for a stroke due to having numbness and weakness on R side of body with the symptoms starting in her head.    Reviewing patient's labs patient did have a positive ANA in April 2024 with a relatively high titer.  Also reviewed patient's notes from neurology, cardiology and pulmonary.  Other labs include B12 which was in the normal limits   Further workup includes imaging including exam and MRI of the brain without contrast that did not show occipital stroke.  Code stroke CT scan and CT angio today.  MRI of the cervical spine does show some bulging disks noted.  No treatment impingement noted.  MRI thoracic unremarkable MRI lumbar with some mild protruding disc and facet arthropathy  Past Medical History:  Diagnosis Date   Essential hypertension 10/08/2022   Hypothyroid    Palpitations    Past Surgical History:  Procedure Laterality Date   APPENDECTOMY     BREAST ENHANCEMENT SURGERY     complete hysterectomy'     PARTIAL HYSTERECTOMY     Social History   Socioeconomic History   Marital status: Married    Spouse name: Not on file   Number of children: Not on file   Years of education: Not on file   Highest education level: Not on file  Occupational History   Not on file  Tobacco Use   Smoking status: Never   Smokeless tobacco: Never  Substance and Sexual  Activity   Alcohol use: No   Drug use: No   Sexual activity: Not on file  Other Topics Concern   Not on file  Social History Narrative   Not on file   Social Determinants of Health   Financial Resource Strain: Not on file  Food Insecurity: No Food Insecurity (10/08/2022)   Hunger Vital Sign    Worried About Running Out of Food in the Last Year: Never true    Ran Out of Food in the Last Year: Never true  Transportation Needs: No Transportation Needs (10/08/2022)   PRAPARE - Administrator, Civil Service (Medical): No    Lack of Transportation (Non-Medical): No  Physical Activity: Not on file  Stress: Not on file  Social Connections: Not on file   Allergies  Allergen Reactions   Codeine     Hives    Sulfa Antibiotics Nausea And Vomiting   Family History  Problem Relation Age of Onset   Emphysema Paternal Grandfather     Current Outpatient Medications (Endocrine & Metabolic):    ESTRADIOL PO, Take 1.5 mg by mouth daily. Compound into a capsule   levothyroxine (SYNTHROID, LEVOTHROID) 100 MCG tablet, Take 112 mcg by mouth daily.   liothyronine (CYTOMEL) 25 MCG tablet, Take 25 mcg by mouth daily.  Current Outpatient Medications (Cardiovascular):    isosorbide mononitrate (IMDUR) 30 MG 24  hr tablet, Take 0.5 tablets (15 mg total) by mouth daily.   Current Outpatient Medications (Analgesics):    acetaminophen (TYLENOL) 325 MG tablet, Take 2 tablets (650 mg total) by mouth every 6 (six) hours as needed for mild pain, moderate pain or fever.   Rimegepant Sulfate (NURTEC) 75 MG TBDP, Take 1 tablet (75 mg total) by mouth daily as needed.   Current Outpatient Medications (Other):    B Complex-C (B-COMPLEX WITH VITAMIN C) tablet, Take 1 tablet by mouth daily.   cholecalciferol (VITAMIN D3) 25 MCG (1000 UNIT) tablet, Take 5,000 Units by mouth daily.   Multiple Vitamin (MULITIVITAMIN WITH MINERALS) TABS, Take 1 tablet by mouth daily.   Reviewed prior external  information including notes and imaging from  primary care provider As well as notes that were available from care everywhere and other healthcare systems.  Past medical history, social, surgical and family history all reviewed in electronic medical record.  No pertanent information unless stated regarding to the chief complaint.   Review of Systems:  No headache, visual changes, nausea, vomiting, diarrhea, constipation, dizziness, abdominal pain, skin rash, fevers, chills, night sweats, weight loss, swollen lymph nodes, body aches, joint swelling, chest pain, shortness of breath, mood changes. POSITIVE muscle aches weakness  Objective  Blood pressure (!) 124/92, pulse 84, height 5\' 3"  (1.6 m), weight 153 lb (69.4 kg), SpO2 96 %.   General: No apparent distress alert and oriented x3 mood and affect normal, dressed appropriately.  HEENT: Pupils equal, extraocular movements intact  Patient does have weakness noted mostly in the C6 and C7 distribution right greater than left.  Deep tendon reflexes are 1+ on the right side of the forearm and left bicep compared to the contralateral side.  Mild thenar eminence wasting noted.  Positive Tinel's on the median nerve at the wrist.  Neck exam mild loss of lordosis but negative Spurling's noted today.   Impression and Recommendations:    The above documentation has been reviewed and is accurate and complete Judi Saa, DO

## 2022-11-18 ENCOUNTER — Ambulatory Visit: Payer: Medicare Other | Admitting: Family Medicine

## 2022-11-18 VITALS — BP 124/92 | HR 84 | Ht 63.0 in | Wt 153.0 lb

## 2022-11-18 DIAGNOSIS — R29898 Other symptoms and signs involving the musculoskeletal system: Secondary | ICD-10-CM | POA: Diagnosis not present

## 2022-11-18 DIAGNOSIS — R202 Paresthesia of skin: Secondary | ICD-10-CM | POA: Diagnosis not present

## 2022-11-18 NOTE — Patient Instructions (Signed)
EMG B UE and B LE I have some ideas of diff labs Take 500mg  of Vit C with iron containing meals Let's see what nerve conduction says, hopefully will give [place] direction See me in 8 weeks

## 2022-11-18 NOTE — Assessment & Plan Note (Signed)
Patient does have paresthesias but also does have significant weakness noted.  Does seem to be going more in the C5 and C6 distribution.  Concerning for potential differential is quite broad including cervical radiculopathy, carpal tunnel, postviral neuritis or potential a peripheral neuropathy that is being contributed to.  Patient has had significant and extensive laboratory workup and workup by other providers at this moment.  CT scan originally did show a remote occipital hemorrhage on the left side given some difficulty with balance and coordination but still does not give [place] a reason for the neuropathy type symptoms.  At this point I would like to consider the possibility of nerve conduction studies to see if we can figure out what is potentially occurring at this time.  If not then I would like to consider the possibility of other laboratory workup including iron, PTH, vitamin D, sedimentation rate, creatinine kinase, immunoglobulins, SPEP to name a few.  Hopefully patient will make significant improvement over the course of several weeks anyhow.  Hopefully the nerve conduction test does give [place] more guidance.  Will follow-up again in 6 to 8 weeks no matter what we will be discussing with patient sooner.  Reviewing patient's outside records, imaging and discussing with her as well as daughters 57 minutes

## 2022-11-19 DIAGNOSIS — I629 Nontraumatic intracranial hemorrhage, unspecified: Secondary | ICD-10-CM | POA: Diagnosis not present

## 2022-11-19 DIAGNOSIS — R202 Paresthesia of skin: Secondary | ICD-10-CM | POA: Diagnosis not present

## 2022-11-19 DIAGNOSIS — I10 Essential (primary) hypertension: Secondary | ICD-10-CM | POA: Diagnosis not present

## 2022-11-19 DIAGNOSIS — Z0000 Encounter for general adult medical examination without abnormal findings: Secondary | ICD-10-CM | POA: Diagnosis not present

## 2022-11-19 DIAGNOSIS — R0789 Other chest pain: Secondary | ICD-10-CM | POA: Diagnosis not present

## 2022-11-19 DIAGNOSIS — Z79899 Other long term (current) drug therapy: Secondary | ICD-10-CM | POA: Diagnosis not present

## 2022-11-19 DIAGNOSIS — E039 Hypothyroidism, unspecified: Secondary | ICD-10-CM | POA: Diagnosis not present

## 2022-11-19 DIAGNOSIS — R0609 Other forms of dyspnea: Secondary | ICD-10-CM | POA: Diagnosis not present

## 2022-11-19 DIAGNOSIS — G43009 Migraine without aura, not intractable, without status migrainosus: Secondary | ICD-10-CM | POA: Diagnosis not present

## 2022-11-20 ENCOUNTER — Other Ambulatory Visit: Payer: Self-pay

## 2022-11-20 ENCOUNTER — Encounter: Payer: Self-pay | Admitting: Family Medicine

## 2022-11-20 DIAGNOSIS — Z0000 Encounter for general adult medical examination without abnormal findings: Secondary | ICD-10-CM | POA: Diagnosis not present

## 2022-11-20 DIAGNOSIS — R202 Paresthesia of skin: Secondary | ICD-10-CM

## 2022-11-22 ENCOUNTER — Ambulatory Visit: Payer: Medicare Other | Admitting: Neurology

## 2022-11-22 DIAGNOSIS — R202 Paresthesia of skin: Secondary | ICD-10-CM

## 2022-11-22 NOTE — Procedures (Signed)
[facility]  [address] [place], [address]  [place], [place] [address] Tel: [phone] Fax: [phone] Test Date:  11/22/2022  Patient: Elizabeth Miranda DOB: 07-02-1950 Physician: Nita Sickle, DO  Sex: Female Height: 5\' 3"  Ref Phys: Antoine Primas, DO  ID#: [id number]   Technician:    History: This is a 72 year old female referred for evaluation of bilateral arm paresthesias and weakness.  NCV & EMG Findings: Extensive electrodiagnostic testing of the right upper extremity and additional studies of the left shows: Bilateral median, ulnar, and mixed palmar sensory responses are within normal limits. Bilateral median and ulnar motor responses are within normal limits. There is no evidence of active or chronic motor axonal loss changes affecting any of the tested muscles.  Motor unit configuration and recruitment pattern is within normal limits.   Impression: This is a normal study of the upper extremities.  In particular, there is no evidence of carpal tunnel syndrome, diffuse myopathy, or a cervical radiculopathy.   ___________________________ Nita Sickle, DO    Nerve Conduction Studies   Stim Site NR Peak (ms) Norm Peak (ms) O-P Amp (V) Norm O-P Amp  Left Median Anti Sensory (2nd Digit)  32 C  Wrist    2.9 <3.8 68.6 >10  Right Median Anti Sensory (2nd Digit)  32 C  Wrist    2.8 <3.8 61.0 >10  Left Ulnar Anti Sensory (5th Digit)  32 C  Wrist    2.7 <3.2 58.0 >5  Right Ulnar Anti Sensory (5th Digit)  32 C  Wrist    2.5 <3.2 55.5 >5     Stim Site NR Onset (ms) Norm Onset (ms) O-P Amp (mV) Norm O-P Amp Site1 Site2 Delta-0 (ms) Dist (cm) Vel (m/s) Norm Vel (m/s)  Left Median Motor (Abd Poll Brev)  32 C  Wrist    3.1 <4.0 10.0 >5 Elbow Wrist 4.6 27.0 59 >50  Elbow    7.7  9.7         Right Median Motor (Abd Poll Brev)  32 C  Wrist    2.7 <4.0 10.1 >5 Elbow Wrist 4.8 26.0 54 >50  Elbow    7.5  9.6         Left Ulnar Motor (Abd Dig Minimi)  32 C  Wrist     2.7 <3.1 8.0 >7 B Elbow Wrist 3.3 21.0 64 >50  B Elbow    6.0  7.0  A Elbow B Elbow 1.3 8.0 62 >50  A Elbow    7.3  6.6         Right Ulnar Motor (Abd Dig Minimi)  32 C  Wrist    2.1 <3.1 9.0 >7 B Elbow Wrist 3.6 21.0 58 >50  B Elbow    5.7  8.7  A Elbow B Elbow 1.7 10.0 59 >50  A Elbow    7.4  8.0            Stim Site NR Peak (ms) Norm Peak (ms) P-T Amp (V) Site1 Site2 Delta-P (ms) Norm Delta (ms)  Left Median/Ulnar Palm Comparison (Wrist - 8cm)  32 C  Median Palm    1.5 <2.2 124.8 Median Palm Ulnar Palm 0.3   Ulnar Palm    1.8 <2.2 22.5      Right Median/Ulnar Palm Comparison (Wrist - 8cm)  32 C  Median Palm    1.5 <2.2 133.3 Median Palm Ulnar Palm 0.2   Ulnar Palm    1.3 <2.2 19.7  Electromyography   Side Muscle Ins.Act Fibs Fasc Recrt Amp Dur Poly Activation Comment  Right 1stDorInt Nml Nml Nml Nml Nml Nml Nml Nml N/A  Right PronatorTeres Nml Nml Nml Nml Nml Nml Nml Nml N/A  Right Biceps Nml Nml Nml Nml Nml Nml Nml Nml N/A  Right Triceps Nml Nml Nml Nml Nml Nml Nml Nml N/A  Right Deltoid Nml Nml Nml Nml Nml Nml Nml Nml N/A  Left 1stDorInt Nml Nml Nml Nml Nml Nml Nml Nml N/A  Left PronatorTeres Nml Nml Nml Nml Nml Nml Nml Nml N/A  Left Biceps Nml Nml Nml Nml Nml Nml Nml Nml N/A  Left Triceps Nml Nml Nml Nml Nml Nml Nml Nml N/A  Left Deltoid Nml Nml Nml Nml Nml Nml Nml Nml N/A      Waveforms:

## 2022-11-25 ENCOUNTER — Telehealth: Payer: Self-pay | Admitting: Cardiology

## 2022-11-25 MED ORDER — PROPRANOLOL HCL 10 MG PO TABS: 10.0000 mg | ORAL_TABLET | Freq: Two times a day (BID) | ORAL | 4 refills | Status: DC

## 2022-11-25 NOTE — Telephone Encounter (Signed)
Spoke to the pt, the nitroglycerin has not resolved her symptoms. She will like to discuss other options for treatment. Will forward to MD and nurse for advise.

## 2022-11-25 NOTE — Telephone Encounter (Signed)
Gaby, Harney - 11/25/2022  1:52 PM [name], MD  Sent: Mon November 25, 2022  2:28 PM  To: [facility] Triage; Loa Socks, LPN         Message  We discussed that if her symptoms did not get better with low dose imdur, we could refer her to the Pulmonologist for further work-up of her shortness of breath given her reassuring cardiac work-up. Happy to refer her if she would like!

## 2022-11-25 NOTE — Telephone Encounter (Signed)
Called the pt back.  She is dealing more with increased palpitations with positional changes more so dizziness.  She states her palpitations are pretty much non-existent when she is sleeping in the recliner vs when she lies flat in the bed.    She states she may be able to get in with a clinic in [place] at [place], that specializes with dysautonomia/autonomic dysfunction, for her Son knows a Provider out there who see's pts for this.  She said the MD out there is a personal friend of her sons.  She said she already called the dysautonomia clinic at [facility] and they cannot see her until Nov 2025.   Pt states she would like to try a trial of propranolol 10 mg po bid.   Advised her to monitor for dizziness on this medication, and if this occurs then she should let us know or stop taking this.   Advised her to really increase her po hydration.  She is aware that treating this is a combo of hydration and medication management.  Advised her that she should tie in neurology as well for this.   Confirmed the pharmacy of choice with the pt.   Pt verbalized understanding and agrees with this plan.  Will send this information back to Dr. Shari Prows as an Lorain Childes.

## 2022-11-25 NOTE — Telephone Encounter (Signed)
Pt c/o medication issue:  1. Name of Medication: Nitroglycerin ER    2. How are you currently taking this medication (dosage and times per day)? As prescribed   3. Are you having a reaction (difficulty breathing--STAT)?   4. What is your medication issue? Patient states this medication doesn't seem to be helping with her issues and would like to discuss this. Please advise.

## 2022-11-25 NOTE — Telephone Encounter (Signed)
[name], [name] - 11/25/2022  1:52 PM [name], MD  Sent: Mon November 25, 2022  3:46 PM  To: Elizabeth Socks, LPN         Message  Is she still having the dizziness as well? I am happy to trial her on propranolol 10mg  twice a day to help with her palpitations but I am a little worried that if she is having dizziness still, this may make things worse.    There are dysautonomia clinics at [facility] and [facility] but the wait lists are very long (currently, I think they are booking out about 1 year). We can refer if she would like. We can also get her in with an APP to discuss other medications we can try to see if we can come up with some combination that works for her. Often times, it is a combination of hydration and medications that target specific symptoms that help make people feel better.    Neurology is also helpful in help guide management as well (I know she sees them too).

## 2022-11-25 NOTE — Telephone Encounter (Signed)
Dr. Shari Prows, the pt mentioned she saw Pulmonology back in May, where they are working her up, with PFTs ordered and will be done in July.   Pulmonology mentioned that they suspect she has dysautonomia, and was going to reach back out to you about this for further advisement.   Pt is still experiencing palpitations in her chest.  She reports long acting imdur 15 mg is not working anymore.    She is seeking further advisement about treatments or referrals for dysautonomia, or should her imdur be increased, as discussed at last OV with possible diagnosis of microvascular angina.    Will route this message to Dr. Shari Prows to further review and advise on. Pulmonology office visit note can be seen in epic on 5/23.   Will follow-up with the pt accordingly thereafter.

## 2022-11-26 ENCOUNTER — Encounter: Payer: Self-pay | Admitting: Cardiology

## 2022-11-26 DIAGNOSIS — G901 Familial dysautonomia [Riley-Day]: Secondary | ICD-10-CM

## 2022-11-26 DIAGNOSIS — R0600 Dyspnea, unspecified: Secondary | ICD-10-CM

## 2022-11-26 DIAGNOSIS — R002 Palpitations: Secondary | ICD-10-CM

## 2022-11-27 ENCOUNTER — Ambulatory Visit: Payer: Medicare Other | Admitting: Cardiovascular Disease

## 2022-12-05 ENCOUNTER — Ambulatory Visit: Payer: Medicare Other | Admitting: Neurology

## 2022-12-05 DIAGNOSIS — R202 Paresthesia of skin: Secondary | ICD-10-CM

## 2022-12-05 NOTE — Procedures (Signed)
[facility]  [address] [place], [address]  [place], [place] [address] Tel: [phone] Fax: [phone] Test Date:  12/05/2022  Patient: Elizabeth Miranda DOB: 12-Oct-1950 Physician: Nita Sickle, DO  Sex: Female Height: 5\' 3"  Ref Phys: Antoine Primas, DO  ID#: [id number]   Technician:    History: This is a 72 year old female referred for evaluation of bilateral leg paresthesias and pain.  NCV & EMG Findings: Electrodiagnostic testing of the right lower extremity and additional studies of the left shows: Bilateral sural and superficial peroneal sensory responses are within normal limits. Bilateral peroneal and tibial motor responses are within normal limits. Bilateral tibial H reflex studies are within normal limits. There is no evidence of active or chronic motor axonal changes affecting any of the tested muscles.  Motor unit configuration and recruitment pattern is within normal limits.   Impression: This is a normal study of the lower extremities.  In particular, there is no evidence of a large fiber sensorimotor polyneuropathy or lumbosacral radiculopathy.    ___________________________ Nita Sickle, DO    Nerve Conduction Studies   Stim Site NR Peak (ms) Norm Peak (ms) O-P Amp (V) Norm O-P Amp  Left Sup Peroneal Anti Sensory (Ant Lat Mall)  32 C  12 cm    2.0 <4.6 8.7 >3  Right Sup Peroneal Anti Sensory (Ant Lat Mall)  32 C  12 cm    2.5 <4.6 7.8 >3  Left Sural Anti Sensory (Lat Mall)  32 C  Calf    2.6 <4.6 27.0 >3  Right Sural Anti Sensory (Lat Mall)  32 C  Calf    2.8 <4.6 22.5 >3     Stim Site NR Onset (ms) Norm Onset (ms) O-P Amp (mV) Norm O-P Amp Site1 Site2 Delta-0 (ms) Dist (cm) Vel (m/s) Norm Vel (m/s)  Left Peroneal Motor (Ext Dig Brev)  32 C  Ankle    3.4 <6.0 2.9 >2.5 B Fib Ankle 6.8 35.0 51 >40  B Fib    10.2  3.0  Poplt B Fib 1.5 8.0 53 >40  Poplt    11.7  2.9         Right Peroneal Motor (Ext Dig Brev)  32 C  Ankle    3.4 <6.0 3.9  >2.5 B Fib Ankle 6.6 35.0 53 >40  B Fib    10.0  3.6  Poplt B Fib 1.4 7.0 50 >40  Poplt    11.4  3.4         Left Tibial Motor (Abd Hall Brev)  32 C  Ankle    3.6 <6.0 12.1 >4 Knee Ankle 8.0 39.0 49 >40  Knee    11.6  10.3         Right Tibial Motor (Abd Hall Brev)  32 C  Ankle    4.1 <6.0 15.2 >4 Knee Ankle 8.7 41.0 47 >40  Knee    12.8  12.0          Electromyography   Side Muscle Ins.Act Fibs Fasc Recrt Amp Dur Poly Activation Comment  Right AntTibialis Nml Nml Nml Nml Nml Nml Nml Nml N/A  Right Gastroc Nml Nml Nml Nml Nml Nml Nml Nml N/A  Right Flex Dig Long Nml Nml Nml Nml Nml Nml Nml Nml N/A  Right RectFemoris Nml Nml Nml Nml Nml Nml Nml Nml N/A  Right GluteusMed Nml Nml Nml Nml Nml Nml Nml Nml N/A  Left AntTibialis Nml Nml Nml Nml Nml Nml Nml Nml N/A  Left Gastroc Nml Nml Nml Nml Nml Nml Nml Nml N/A  Left Flex Dig Long Nml Nml Nml Nml Nml Nml Nml Nml N/A  Left RectFemoris Nml Nml Nml Nml Nml Nml Nml Nml N/A  Left GluteusMed Nml Nml Nml Nml Nml Nml Nml Nml N/A      Waveforms:

## 2022-12-06 NOTE — Telephone Encounter (Signed)
dysautonomic clinic Received: Today Janett Billow, LPN Referral faxed to [facility] [phone] office number [phone]

## 2022-12-09 ENCOUNTER — Telehealth: Payer: Self-pay | Admitting: Family Medicine

## 2022-12-09 ENCOUNTER — Other Ambulatory Visit: Payer: Self-pay | Admitting: Family Medicine

## 2022-12-09 DIAGNOSIS — M2550 Pain in unspecified joint: Secondary | ICD-10-CM

## 2022-12-09 NOTE — Telephone Encounter (Signed)
Patient called in response to her recent nerve conduction study results.  Dr Katrinka Blazing mentioned that everything was normal and functioning properly but she is still have a lot of issues and not feeling any better. She asked what next steps would be?  Please advise.  (Patient is scheduled to see Dr Katrinka Blazing on 7/29 but was concerned with waiting that long.)

## 2022-12-09 NOTE — Telephone Encounter (Signed)
Spoke with patient. Labs have been ordered.

## 2022-12-10 ENCOUNTER — Other Ambulatory Visit (INDEPENDENT_AMBULATORY_CARE_PROVIDER_SITE_OTHER): Payer: Medicare Other

## 2022-12-10 DIAGNOSIS — M2550 Pain in unspecified joint: Secondary | ICD-10-CM | POA: Diagnosis not present

## 2022-12-10 LAB — IBC PANEL
Iron: 100 ug/dL (ref 42–145)
Saturation Ratios: 25.6 % (ref 20.0–50.0)
TIBC: 390.6 ug/dL (ref 250.0–450.0)
Transferrin: 279 mg/dL (ref 212.0–360.0)

## 2022-12-10 LAB — SEDIMENTATION RATE: Sed Rate: 16 mm/hr (ref 0–30)

## 2022-12-10 LAB — VITAMIN D 25 HYDROXY (VIT D DEFICIENCY, FRACTURES): VITD: 46.98 ng/mL (ref 30.00–100.00)

## 2022-12-10 LAB — CK: Total CK: 32 U/L (ref 7–177)

## 2022-12-13 LAB — PROTEIN ELECTROPHORESIS, SERUM
Albumin ELP: 4.1 g/dL (ref 3.8–4.8)
Alpha 1: 0.3 g/dL (ref 0.2–0.3)
Alpha 2: 0.7 g/dL (ref 0.5–0.9)
Beta 2: 0.3 g/dL (ref 0.2–0.5)
Beta Globulin: 0.5 g/dL (ref 0.4–0.6)
Gamma Globulin: 0.9 g/dL (ref 0.8–1.7)
Total Protein: 6.8 g/dL (ref 6.1–8.1)

## 2022-12-13 LAB — PTH, INTACT AND CALCIUM
Calcium: 9.7 mg/dL (ref 8.6–10.4)
PTH: 20 pg/mL (ref 16–77)

## 2022-12-13 LAB — HIV ANTIBODY (ROUTINE TESTING W REFLEX): HIV 1&2 Ab, 4th Generation: NONREACTIVE

## 2022-12-13 LAB — IGG, IGA, IGM
IgG (Immunoglobin G), Serum: 1024 mg/dL (ref 600–1540)
IgM, Serum: 44 mg/dL — ABNORMAL LOW (ref 50–300)
Immunoglobulin A: 155 mg/dL (ref 70–320)

## 2022-12-17 ENCOUNTER — Encounter: Payer: Self-pay | Admitting: Family Medicine

## 2022-12-24 ENCOUNTER — Telehealth: Payer: Self-pay | Admitting: Cardiology

## 2022-12-24 MED ORDER — PROPRANOLOL HCL 20 MG PO TABS: 20.0000 mg | ORAL_TABLET | Freq: Two times a day (BID) | ORAL | 6 refills | Status: DC

## 2022-12-24 NOTE — Telephone Encounter (Signed)
Spoke with Dr. Shari Prows about this message.  Per Dr. Shari Prows, we can increase her propranolol to 20 mg po bid.  Advised the pt of this.  Advised her to continue monitoring her pressures and rates at home.   Confirmed the pharmacy of choice with the pt.  Pt states she actually will be seeing a MD in [place] next week who specializes in [place] as well as dysautonomia.  She said they may do testing on her.  She will keep [place] posted on how the appt went and if results need to be sent to our office to have on file.  Also advised the pt to keep her follow-up appt with Robin Searing NP as scheduled in August.  Informed her that Dr. Shari Prows wants her to establish care with Dr. Cristal Deer at [facility] thereafter.  Pt aware that I will place this in the appt note when she see's Alden Server on 8/5, so that when he sends her to check out to arrange her next follow-up appt, he can arrange this with Dr. Cristal Deer at [facility] location.  Pt verbalized understanding and agrees with this plan.

## 2022-12-24 NOTE — Telephone Encounter (Signed)
Returned a call back to the pt.  Pt states that she was doing very well and felt like she was on a path of recovery, until the event she experienced last Saturday.   Pt states on last Sat, she had one of her worse episodes she's ever experienced with feeling like she had a shock from head-to-toe.  She also got extremely nauseated during this time.  Pt states she felt a tingling sensation and shock that started in her head and went down her body.  She felt like she could hardly move and talk.    She said she checked her pressure and rate and they were both elevated for her, and has remained that way since Saturday.  Pt states her baseline BP is usually low 100s systolic and rates usually run in the 70s.  She said last Saturday her pressure was 155/85 and HR-90s.  She said both her BP/HR have remained in this range since then.  She said last night she had some palpitations before bedtime, and did run a 3 lead on her [employer] mobile app, and rhythm showed NSR.  Her rate was still in the 90s.  Pt is still awaiting for [facility] to call her back for her new pt appt with them at their dysautonomia clinic.  We referred her there and provided all appropriate documentation to them several weeks ago.  She is just awaiting for the call back from them to get the appt scheduled.  Did advise her to follow-up with them about this, so that they can get her scheduled.   Pt thinks that she needs her propranolol dose increased to help with elevated BPs/HRs.  She states she was doing well on the current dose with hardly any episodes experienced.    She denies any other cardiac complaints at this time, and is at home resting comfortably.  Informed the pt that I will route this message to Dr. Shari Prows to further review and advise on.   She is aware that I will follow-up with her accordingly thereafter.   Pt also inquiring who she should establish with since Dr. Shari Prows is leaving.  I suggested Dr. Cristal Deer to  her, but she is aware that Dr. Shari Prows will decide on this matter.  Pt agreed to plan.

## 2022-12-24 NOTE — Telephone Encounter (Signed)
Patient states she has been having more events and would like to review her medications. Patient is requesting to speak directly with [name] to discuss further if possible.

## 2022-12-31 ENCOUNTER — Telehealth: Payer: Self-pay | Admitting: Cardiology

## 2022-12-31 NOTE — Telephone Encounter (Signed)
Osborne Casco from [employer] called to see if a fax was sent of CTA report and CD with images. Please call to [phone]. Fax number is [phone]

## 2022-12-31 NOTE — Telephone Encounter (Signed)
Spoke with Elizabeth Miranda and told her I would forward the document requests to medical records and let them know the lease forms are in her chart.

## 2023-01-06 ENCOUNTER — Encounter: Payer: Self-pay | Admitting: Cardiology

## 2023-01-10 NOTE — Progress Notes (Unsigned)
Elizabeth Miranda [address] [place] [address] Phone: [phone] Subjective:   Elizabeth Miranda, am serving as a scribe for Dr. Antoine Primas.  I'm seeing this patient by the request  of:  Elizabeth Stabile, MD  CC: Multiple complaint follow-up  EXB:[id number]  11/18/2022 Patient does have paresthesias but also does have significant weakness noted.  Does seem to be going more in the C5 and C6 distribution.  Concerning for potential differential is quite broad including cervical radiculopathy, carpal tunnel, postviral neuritis or potential a peripheral neuropathy that is being contributed to.  Patient has had significant and extensive laboratory workup and workup by other providers at this moment.  CT scan originally did show a remote occipital hemorrhage on the left side given some difficulty with balance and coordination but still does not give [place] a reason for the neuropathy type symptoms.  At this point I would like to consider the possibility of nerve conduction studies to see if we can figure out what is potentially occurring at this time.  If not then I would like to consider the possibility of other laboratory workup including iron, PTH, vitamin D, sedimentation rate, creatinine kinase, immunoglobulins, SPEP to name a few.  Hopefully patient will make significant improvement over the course of several weeks anyhow.  Hopefully the nerve conduction test does give [place] more guidance.  Will follow-up again in 6 to 8 weeks no matter what we will be discussing with patient sooner.  Reviewing patient's outside records, imaging and discussing with her as well as daughters 71 minutes      Update 01/13/2023 Elizabeth Miranda is a 72 y.o. female coming in with complaint of B leg parasthesia. Patient states that she has had 5+ episodes which her legs went numb and numbness went all the way up her spine to her neck. Unable to walk, use hands or raise arms overhead. Will get short  of breath with arm movements during these episodes. Also noticed visual disturbances in conjunction with other symptoms.    Patient did have some other labs again that were unremarkable.  Still did have a faint positive ANA and awaiting rheumatology.  Reviewed MRI from neurologist.  Mild spinal stenosis at L3-L4, cervical shows mild spinal stenosis from C5-C7  Nerve conduction study shows that both the upper and lower extremities nerves are working efficiently with no significant injury.  Past Medical History:  Diagnosis Date   Essential hypertension 10/08/2022   Hypothyroid    Palpitations    Past Surgical History:  Procedure Laterality Date   APPENDECTOMY     BREAST ENHANCEMENT SURGERY     complete hysterectomy'     PARTIAL HYSTERECTOMY     Social History   Socioeconomic History   Marital status: Married    Spouse name: Not on file   Number of children: Not on file   Years of education: Not on file   Highest education level: Not on file  Occupational History   Not on file  Tobacco Use   Smoking status: Never   Smokeless tobacco: Never  Substance and Sexual Activity   Alcohol use: No   Drug use: No   Sexual activity: Not on file  Other Topics Concern   Not on file  Social History Narrative   Not on file   Social Determinants of Health   Financial Resource Strain: Not on file  Food Insecurity: No Food Insecurity (10/08/2022)   Hunger Vital Sign  Worried About Programme researcher, broadcasting/film/video in the Last Year: Never true    Ran Out of Food in the Last Year: Never true  Transportation Needs: No Transportation Needs (10/08/2022)   PRAPARE - Administrator, Civil Service (Medical): No    Lack of Transportation (Non-Medical): No  Physical Activity: Not on file  Stress: Not on file  Social Connections: Not on file   Allergies  Allergen Reactions   Codeine     Hives    Sulfa Antibiotics Nausea And Vomiting   Family History  Problem Relation Age of Onset    Emphysema Paternal Grandfather     Current Outpatient Medications (Endocrine & Metabolic):    ESTRADIOL PO, Take 1.5 mg by mouth daily. Compound into a capsule   levothyroxine (SYNTHROID, LEVOTHROID) 100 MCG tablet, Take 112 mcg by mouth daily.   liothyronine (CYTOMEL) 25 MCG tablet, Take 25 mcg by mouth daily.  Current Outpatient Medications (Cardiovascular):    isosorbide mononitrate (IMDUR) 30 MG 24 hr tablet, Take 0.5 tablets (15 mg total) by mouth daily.   propranolol (INDERAL) 10 MG tablet, Take 10 mg by mouth 2 (two) times daily.   propranolol (INDERAL) 20 MG tablet, Take 1 tablet (20 mg total) by mouth 2 (two) times daily. (Patient taking differently: Take 20 mg by mouth daily as needed.)   Current Outpatient Medications (Analgesics):    acetaminophen (TYLENOL) 325 MG tablet, Take 2 tablets (650 mg total) by mouth every 6 (six) hours as needed for mild pain, moderate pain or fever.   Rimegepant Sulfate (NURTEC) 75 MG TBDP, Take 1 tablet (75 mg total) by mouth daily as needed.   Current Outpatient Medications (Other):    B Complex-C (B-COMPLEX WITH VITAMIN C) tablet, Take 1 tablet by mouth daily.   cholecalciferol (VITAMIN D3) 25 MCG (1000 UNIT) tablet, Take 5,000 Units by mouth daily.   diazepam (VALIUM) 5 MG tablet, Take 1 tablet (5 mg total) by mouth every 12 (twelve) hours as needed. One tab by mouth, 2 hours before procedure.   Multiple Vitamin (MULITIVITAMIN WITH MINERALS) TABS, Take 1 tablet by mouth daily.   Reviewed prior external information including notes and imaging from  primary care provider As well as notes that were available from care everywhere and other healthcare systems.  Past medical history, social, surgical and family history all reviewed in electronic medical record.  No pertanent information unless stated regarding to the chief complaint.   Review of Systems:  No  nausea, vomiting, diarrhea, constipation, dizziness, abdominal pain, skin rash, fevers,  chills, night sweats, weight loss, swollen lymph nodes, body aches, joint swelling, chest pain, shortness of breath, mood changes. POSITIVE muscle aches, headache, intermittent visual changes muscle spasms, can get short of breath with cramping episodes  Objective  Blood pressure 124/84, pulse 68, height 5\' 3"  (1.6 m), weight 158 lb (71.7 kg), SpO2 94%.   General: No apparent distress alert and oriented x3 mood and affect normal, dressed appropriately.  HEENT: Pupils equal, extraocular movements intact  Respiratory: Patient's speak in full sentences and does not appear short of breath  Cardiovascular: No lower extremity edema, non tender, no erythema  Back exam does have some loss of lordosis.  Sitting relatively comfortably in the chair.    Impression and Recommendations:     The above documentation has been reviewed and is accurate and complete Judi Saa, DO

## 2023-01-13 ENCOUNTER — Ambulatory Visit (INDEPENDENT_AMBULATORY_CARE_PROVIDER_SITE_OTHER): Payer: Medicare Other | Admitting: Internal Medicine

## 2023-01-13 ENCOUNTER — Encounter: Payer: Self-pay | Admitting: Family Medicine

## 2023-01-13 ENCOUNTER — Ambulatory Visit: Payer: Medicare Other | Admitting: Family Medicine

## 2023-01-13 VITALS — BP 124/84 | HR 68 | Ht 63.0 in | Wt 158.0 lb

## 2023-01-13 DIAGNOSIS — M2550 Pain in unspecified joint: Secondary | ICD-10-CM

## 2023-01-13 DIAGNOSIS — M792 Neuralgia and neuritis, unspecified: Secondary | ICD-10-CM | POA: Diagnosis not present

## 2023-01-13 DIAGNOSIS — R0609 Other forms of dyspnea: Secondary | ICD-10-CM | POA: Diagnosis not present

## 2023-01-13 LAB — PULMONARY FUNCTION TEST
DL/VA % pred: 97 %
DL/VA: 4.06 ml/min/mmHg/L
DLCO cor % pred: 97 %
DLCO cor: 18.19 ml/min/mmHg
DLCO unc % pred: 97 %
DLCO unc: 18.19 ml/min/mmHg
FEF 25-75 Pre: 1.86 L/sec
FEF2575-%Pred-Pre: 109 %
FEV1-%Pred-Pre: 110 %
FEV1-Pre: 2.31 L
FEV1FVC-%Pred-Pre: 99 %
FEV6-%Pred-Pre: 116 %
FEV6-Pre: 3.06 L
FEV6FVC-%Pred-Pre: 104 %
FVC-%Pred-Pre: 110 %
FVC-Pre: 3.07 L
Pre FEV1/FVC ratio: 75 %
Pre FEV6/FVC Ratio: 100 %
RV % pred: 90 %
RV: 1.99 L
TLC % pred: 97 %
TLC: 4.77 L

## 2023-01-13 MED ORDER — DIAZEPAM 5 MG PO TABS: 5.0000 mg | ORAL_TABLET | Freq: Two times a day (BID) | ORAL | 0 refills | Status: DC | PRN

## 2023-01-13 NOTE — Patient Instructions (Addendum)
Full PFT performed today except Post Spiro. 

## 2023-01-13 NOTE — Assessment & Plan Note (Addendum)
Patient has these abnormal neuralgias and paresthesias.  Seems to have more muscle cramping that is noted as well.  Patient did notice some improvement actually with Valium and given the prescription to have on hand.  In addition of this we did discuss at great length about the possibility of medicine such as Cymbalta that could also be helpful.  Patient is awaiting some other imaging studies from an outside facility in [place].  Depending on the findings we will discuss if this changes any medical management.  I do believe that the images if they are normal we should highly consider the Cymbalta.  We discussed the other ideas of a potential sleep study with an EEG aspect.  The patient has had EEGs previously as well.  Will get 1 more laboratory workup for a anti-GAD with patient recently are reading more about stiff body syndrome and having some signs and symptoms of this as well.  Total time reviewing patient's notes as well as discussing with patient 33 minutes

## 2023-01-13 NOTE — Progress Notes (Signed)
Full PFT performed today except Post Spiro. 

## 2023-01-13 NOTE — Patient Instructions (Addendum)
Read about Cymbalta Send message with other results See me again in 2 months

## 2023-01-16 ENCOUNTER — Telehealth: Payer: Self-pay | Admitting: Family Medicine

## 2023-01-16 ENCOUNTER — Other Ambulatory Visit: Payer: Self-pay

## 2023-01-16 DIAGNOSIS — I739 Peripheral vascular disease, unspecified: Secondary | ICD-10-CM

## 2023-01-16 NOTE — Telephone Encounter (Signed)
Referral placed and patient notified.  

## 2023-01-16 NOTE — Telephone Encounter (Signed)
Message from [facility] -  Per dana pt is to transfer care to [place] completely and she is to see Dr.patel since patel was the one that done her EMG test. Patient declined.   There is also a patient message about this as well.   Please advise.

## 2023-01-16 NOTE — Telephone Encounter (Signed)
Pt called, had AI imaging and is emailing me the results for your review.  Pt would like to be referred to Dr. Loleta Chance at [facility] for her condition, are we able to do this?

## 2023-01-19 NOTE — Progress Notes (Unsigned)
Office Visit    Patient Name: Elizabeth Miranda Date of Encounter: 01/19/2023  Primary Care Provider:  Benita Stabile, MD Primary Cardiologist:  Elizabeth Miss, MD Primary Electrophysiologist: None   Past Medical History    Past Medical History:  Diagnosis Date   Essential hypertension 10/08/2022   Hypothyroid    Palpitations    Past Surgical History:  Procedure Laterality Date   APPENDECTOMY     BREAST ENHANCEMENT SURGERY     complete hysterectomy'     PARTIAL HYSTERECTOMY      Allergies  Allergies  Allergen Reactions   Codeine     Hives    Sulfa Antibiotics Nausea And Vomiting     History of Present Illness    Elizabeth Miranda  is a 72 year old female with a PMH of palpitations, DOE, hypothyroidism, HTN left occipital ICH who presents today for 89-month follow-up.  [name] was seen initially by Dr. Elease Miranda on 05/19/2022 evaluation of palpitations and shortness of breath.  She underwent a 2D echo that showed EF of 60 to 65% with no RWMA and no significant valvular abnormalities.  ZIO monitor was worn that showed sinus rhythm with no evidence of AF or sustained tachyarrhythmias.  She had coronary CTA completed 08/2022 with calcium score 0 and normal coronaries.  She was seen in the ED on 09/26/2022 with complaint of shortness of breath and dizziness with weakness. MRI of the head that showed incidental finding of small chronic ICH and normal CTA of the head and neck with no significant occlusions or stenosis.  He was referred to neurology for further evaluation.  She was last seen by Dr. Shari Miranda 10/17/2022 for post ED follow-up. Patient continued to have significant shortness of breath with climbing stairs and inclines.  She also continues to complain of chest pain and was placed on low-dose Imdur with plan to refer to pulmonology if unable to relieve pain.  She was encouraged to try compression socks and hydration due to orthostasis.  Since last being seen in the office  patient reports that she has had improvement in her palpitations and chest discomfort since starting Imdur.  She does however report tightness in the lower extremities that travels up to her spine and back of her head.  She is also unable to raise her hands over her head and states that symptoms occur primarily when at rest or when walking a short distance.  These symptoms began in January most recent episode occurring 2 to 3 weeks ago.  She does report alleviation of symptoms with as needed Valium.  She has been evaluated by neurology with no abnormalities determined.  She was recently seen in [place] by a health advocate and underwent extensive full body scans via AI. She is interested in being referred to another neurologist for further evaluation.  Her blood pressures today were stable at 114/64 and heart rate was 63 bpm.  She has experienced some episodes of dysautonomia tachycardia and low blood pressures.  Patient denies chest pain, palpitations, dyspnea, PND, orthopnea, nausea, vomiting, dizziness, syncope, edema, weight gain, or early satiety.  Home Medications    Current Outpatient Medications  Medication Sig Dispense Refill   acetaminophen (TYLENOL) 325 MG tablet Take 2 tablets (650 mg total) by mouth every 6 (six) hours as needed for mild pain, moderate pain or fever. 30 tablet 3   B Complex-C (B-COMPLEX WITH VITAMIN C) tablet Take 1 tablet by mouth daily.     cholecalciferol (VITAMIN  D3) 25 MCG (1000 UNIT) tablet Take 5,000 Units by mouth daily.     diazepam (VALIUM) 5 MG tablet Take 1 tablet (5 mg total) by mouth every 12 (twelve) hours as needed. One tab by mouth, 2 hours before procedure. 10 tablet 0   ESTRADIOL PO Take 1.5 mg by mouth daily. Compound into a capsule     isosorbide mononitrate (IMDUR) 30 MG 24 hr tablet Take 0.5 tablets (15 mg total) by mouth daily. 45 tablet 3   levothyroxine (SYNTHROID, LEVOTHROID) 100 MCG tablet Take 112 mcg by mouth daily.  4   liothyronine (CYTOMEL)  25 MCG tablet Take 25 mcg by mouth daily.     Multiple Vitamin (MULITIVITAMIN WITH MINERALS) TABS Take 1 tablet by mouth daily.     propranolol (INDERAL) 10 MG tablet Take 10 mg by mouth 2 (two) times daily.     propranolol (INDERAL) 20 MG tablet Take 1 tablet (20 mg total) by mouth 2 (two) times daily. (Patient taking differently: Take 20 mg by mouth daily as needed.) 60 tablet 6   Rimegepant Sulfate (NURTEC) 75 MG TBDP Take 1 tablet (75 mg total) by mouth daily as needed. 8 tablet 6   No current facility-administered medications for this visit.     Review of Systems  Please see the history of present illness.    (+) Anxiety, (+) Muscle spasms, fatigue, brain fog  All other systems reviewed and are otherwise negative except as noted above.  Physical Exam    Wt Readings from Last 3 Encounters:  01/13/23 158 lb (71.7 kg)  11/18/22 153 lb (69.4 kg)  11/07/22 151 lb 12.8 oz (68.9 kg)   IO:NGEXB were no vitals filed for this visit.,There is no height or weight on file to calculate BMI.  Constitutional:      Appearance: Healthy appearance. Not in distress.  Neck:     Vascular: JVD normal.  Pulmonary:     Effort: Pulmonary effort is normal.     Breath sounds: No wheezing. No rales. Diminished in the bases Cardiovascular:     Normal rate. Regular rhythm. Normal S1. Normal S2.      Murmurs: There is no murmur.  Edema:    Peripheral edema absent.  Abdominal:     Palpations: Abdomen is soft non tender. There is no hepatomegaly.  Skin:    General: Skin is warm and dry.  Neurological:     General: No focal deficit present.     Mental Status: Alert and oriented to person, place and time.     Cranial Nerves: Cranial nerves are intact.  EKG/LABS/ Recent Cardiac Studies    ECG personally reviewed by me today -none completed today   Lab Results  Component Value Date   WBC 6.4 10/08/2022   HGB 12.6 10/08/2022   HCT 37.7 10/08/2022   MCV 90.4 10/08/2022   PLT 310 10/08/2022    Lab Results  Component Value Date   CREATININE 0.68 10/08/2022   BUN 15 10/08/2022   NA 136 10/08/2022   K 3.6 10/08/2022   CL 104 10/08/2022   CO2 23 10/08/2022   Lab Results  Component Value Date   ALT 19 10/08/2022   AST 23 10/08/2022   ALKPHOS 56 10/08/2022   BILITOT 0.6 10/08/2022   Lab Results  Component Value Date   CHOL 187 10/07/2022   HDL 42 10/07/2022   LDLCALC 99 10/07/2022   TRIG 229 (H) 10/07/2022   CHOLHDL 4.5 10/07/2022    Lab  Results  Component Value Date   HGBA1C 5.7 (H) 10/07/2022     Assessment & Plan    1.  Essential hypertension: -Patient's blood pressure today was well-controlled at 114/64 -Continue propranolol 20 mg with 10 mg as needed  2.  Dyspnea on exertion: -Today patient reports resolution of shortness of breath -Continue follow-up with pulmonology  3.  Palpitations: -Today patient reports that her palpitations have resolved since starting Imdur 30 mg daily -Patient does report some episodes of possible dysautonomia with elevated heart rate and labile blood pressures. -She will document blood pressures over the next few weeks. -Ambulatory referral to electrophysiology to discuss possible dysautonomia.  4.  Hypothyroidism: -Currently followed by PCP.  5.  Dystonia: -Patient reports episodes of involuntary muscle contractions that occur from her heels up to the back of her spine. -She has been evaluated by neurology and is now currently interested in seeking a second opinion. -Ambulatory referral to [facility] neurology   Disposition: Follow-up with  Dr. Cristal Deer or APP in 6 months    Medication Adjustments/Labs and Tests Ordered: Current medicines are reviewed at length with the patient today.  Concerns regarding medicines are outlined above.   Signed, Napoleon Form, Leodis Rains, NP 01/19/2023, 1:53 PM [facility]

## 2023-01-20 ENCOUNTER — Encounter: Payer: Self-pay | Admitting: Nurse Practitioner

## 2023-01-20 ENCOUNTER — Ambulatory Visit: Payer: Medicare Other | Attending: Nurse Practitioner | Admitting: Nurse Practitioner

## 2023-01-20 VITALS — BP 114/64 | HR 63 | Ht 63.0 in | Wt 155.4 lb

## 2023-01-20 DIAGNOSIS — I10 Essential (primary) hypertension: Secondary | ICD-10-CM | POA: Diagnosis not present

## 2023-01-20 DIAGNOSIS — R002 Palpitations: Secondary | ICD-10-CM

## 2023-01-20 DIAGNOSIS — G249 Dystonia, unspecified: Secondary | ICD-10-CM | POA: Diagnosis not present

## 2023-01-20 DIAGNOSIS — E039 Hypothyroidism, unspecified: Secondary | ICD-10-CM

## 2023-01-20 DIAGNOSIS — R0609 Other forms of dyspnea: Secondary | ICD-10-CM | POA: Diagnosis not present

## 2023-01-20 MED ORDER — PROPRANOLOL HCL 10 MG PO TABS: 10.0000 mg | ORAL_TABLET | Freq: Two times a day (BID) | ORAL | 3 refills | Status: DC

## 2023-01-20 NOTE — Patient Instructions (Addendum)
Medication Instructions:  Your physician recommends that you continue on your current medications as directed. Please refer to the Current Medication list given to you today. *If you need a refill on your cardiac medications before your next appointment, please call your pharmacy*   Lab Work: None ordered   Testing/Procedures: None Ordered   Follow-Up: At [facility], you and your health needs are our priority.  As part of our continuing mission to provide you with exceptional heart care, we have created designated Provider Care Teams.  These Care Teams include your primary Cardiologist (physician) and Advanced Practice Providers (APPs -  Physician Assistants and Nurse Practitioners) who all work together to provide you with the care you need, when you need it.  We recommend signing up for the patient portal called "MyChart".  Sign up information is provided on this After Visit Summary.  MyChart is used to connect with patients for Virtual Visits (Telemedicine).  Patients are able to view lab/test results, encounter notes, upcoming appointments, etc.  Non-urgent messages can be sent to your provider as well.   To learn more about what you can do with MyChart, go to [email or web address].    Your next appointment:   6 month(s)  Provider:   Jodelle Red, MD    Other Instructions You have been referred to Neurology and EP.

## 2023-01-27 DIAGNOSIS — I10 Essential (primary) hypertension: Secondary | ICD-10-CM | POA: Diagnosis not present

## 2023-01-27 DIAGNOSIS — I629 Nontraumatic intracranial hemorrhage, unspecified: Secondary | ICD-10-CM | POA: Diagnosis not present

## 2023-01-27 DIAGNOSIS — E039 Hypothyroidism, unspecified: Secondary | ICD-10-CM | POA: Diagnosis not present

## 2023-01-27 DIAGNOSIS — R0609 Other forms of dyspnea: Secondary | ICD-10-CM | POA: Diagnosis not present

## 2023-01-27 DIAGNOSIS — Z79899 Other long term (current) drug therapy: Secondary | ICD-10-CM | POA: Diagnosis not present

## 2023-01-27 DIAGNOSIS — G43009 Migraine without aura, not intractable, without status migrainosus: Secondary | ICD-10-CM | POA: Diagnosis not present

## 2023-01-27 DIAGNOSIS — R0789 Other chest pain: Secondary | ICD-10-CM | POA: Diagnosis not present

## 2023-01-27 DIAGNOSIS — R202 Paresthesia of skin: Secondary | ICD-10-CM | POA: Diagnosis not present

## 2023-01-27 DIAGNOSIS — R002 Palpitations: Secondary | ICD-10-CM | POA: Diagnosis not present

## 2023-01-28 DIAGNOSIS — H35033 Hypertensive retinopathy, bilateral: Secondary | ICD-10-CM | POA: Diagnosis not present

## 2023-01-28 DIAGNOSIS — H524 Presbyopia: Secondary | ICD-10-CM | POA: Diagnosis not present

## 2023-02-06 DIAGNOSIS — E039 Hypothyroidism, unspecified: Secondary | ICD-10-CM | POA: Diagnosis not present

## 2023-02-06 DIAGNOSIS — R760 Raised antibody titer: Secondary | ICD-10-CM | POA: Diagnosis not present

## 2023-02-14 DIAGNOSIS — G43E11 Chronic migraine with aura, intractable, with status migrainosus: Secondary | ICD-10-CM | POA: Diagnosis not present

## 2023-02-14 DIAGNOSIS — I10 Essential (primary) hypertension: Secondary | ICD-10-CM | POA: Diagnosis not present

## 2023-02-14 DIAGNOSIS — R2990 Unspecified symptoms and signs involving the nervous system: Secondary | ICD-10-CM | POA: Diagnosis not present

## 2023-02-14 DIAGNOSIS — G43909 Migraine, unspecified, not intractable, without status migrainosus: Secondary | ICD-10-CM | POA: Diagnosis not present

## 2023-02-14 DIAGNOSIS — R002 Palpitations: Secondary | ICD-10-CM | POA: Diagnosis not present

## 2023-02-14 DIAGNOSIS — R0609 Other forms of dyspnea: Secondary | ICD-10-CM | POA: Diagnosis not present

## 2023-02-14 DIAGNOSIS — R252 Cramp and spasm: Secondary | ICD-10-CM | POA: Diagnosis not present

## 2023-02-14 DIAGNOSIS — E039 Hypothyroidism, unspecified: Secondary | ICD-10-CM | POA: Diagnosis not present

## 2023-02-14 DIAGNOSIS — R9089 Other abnormal findings on diagnostic imaging of central nervous system: Secondary | ICD-10-CM | POA: Diagnosis not present

## 2023-03-04 ENCOUNTER — Ambulatory Visit: Payer: Medicare Other | Attending: Internal Medicine | Admitting: Internal Medicine

## 2023-03-04 ENCOUNTER — Encounter: Payer: Self-pay | Admitting: Internal Medicine

## 2023-03-04 VITALS — BP 122/78 | HR 64 | Ht 63.0 in | Wt 154.0 lb

## 2023-03-04 DIAGNOSIS — R0609 Other forms of dyspnea: Secondary | ICD-10-CM | POA: Diagnosis not present

## 2023-03-04 NOTE — Progress Notes (Unsigned)
HPI Ms. Guillory is referred by Robin Searing, NP for evaluation of autonomic dysfunction. The patient was well until a tick bite last year. Since then she has had multiple complaints. She notes that she has taken doxycycline 2 or 3 times. After the first treatment she felt better from the Doxy. Since then she has experienced problems with headaches, sob, especially when lifting her arms above her head. She has had a significant work up with a CT scan showing a small ICH which was thought to be incidental. She has not had frank syncope. She does have palpitations. She has chest pain with a coronary CT showing normal coronary arteries. She wore a zio monitor which demonstrated no atrial fib or significant SVT. She can only walk a short distance without having to stop. She has palpitations. No frank syncope. She brings in a log of her bp's and HR's which show bp's as high as 174 and as low as 90.  Allergies  Allergen Reactions   Codeine     Hives    Sulfa Antibiotics Nausea And Vomiting     Current Outpatient Medications  Medication Sig Dispense Refill   acetaminophen (TYLENOL) 325 MG tablet Take 2 tablets (650 mg total) by mouth every 6 (six) hours as needed for mild pain, moderate pain or fever. 30 tablet 3   acyclovir (ZOVIRAX) 800 MG tablet Take 800 mg by mouth as needed.     B Complex-C (B-COMPLEX WITH VITAMIN C) tablet Take 1 tablet by mouth daily.     cholecalciferol (VITAMIN D3) 25 MCG (1000 UNIT) tablet Take 5,000 Units by mouth daily.     diazepam (VALIUM) 5 MG tablet Take 0.5 mg by mouth every 6 (six) hours as needed for anxiety.     ESTRADIOL PO Take 1.5 mg by mouth daily. Compound into a capsule     isosorbide mononitrate (IMDUR) 30 MG 24 hr tablet Take 0.5 tablets (15 mg total) by mouth daily. 45 tablet 3   levothyroxine (SYNTHROID, LEVOTHROID) 100 MCG tablet Take 112 mcg by mouth daily.  4   liothyronine (CYTOMEL) 5 MCG tablet Take 5 mcg by mouth daily.     methocarbamol  (ROBAXIN) 750 MG tablet Take 1 tablet by mouth every 8 (eight) hours as needed.     Multiple Vitamin (MULITIVITAMIN WITH MINERALS) TABS Take 1 tablet by mouth daily.     nitrofurantoin, macrocrystal-monohydrate, (MACROBID) 100 MG capsule Take 100 mg by mouth as needed.     propranolol (INDERAL) 10 MG tablet Take 1 tablet (10 mg total) by mouth 2 (two) times daily. 180 tablet 3   propranolol (INDERAL) 20 MG tablet Take 1 tablet (20 mg total) by mouth 2 (two) times daily. (Patient taking differently: Take 20 mg by mouth daily as needed.) 60 tablet 6   Rimegepant Sulfate (NURTEC) 75 MG TBDP Take 1 tablet (75 mg total) by mouth daily as needed. 8 tablet 6   No current facility-administered medications for this visit.     Past Medical History:  Diagnosis Date   Essential hypertension 10/08/2022   Hypothyroid    Palpitations     ROS:   All systems reviewed and negative except as noted in the HPI.   Past Surgical History:  Procedure Laterality Date   APPENDECTOMY     BREAST ENHANCEMENT SURGERY     complete hysterectomy'     PARTIAL HYSTERECTOMY       Family History  Problem Relation Age of Onset  Emphysema Paternal Grandfather      Social History   Socioeconomic History   Marital status: Married    Spouse name: Not on file   Number of children: Not on file   Years of education: Not on file   Highest education level: Not on file  Occupational History   Not on file  Tobacco Use   Smoking status: Never   Smokeless tobacco: Never  Substance and Sexual Activity   Alcohol use: No   Drug use: No   Sexual activity: Not on file  Other Topics Concern   Not on file  Social History Narrative   Not on file   Social Determinants of Health   Financial Resource Strain: Not on file  Food Insecurity: No Food Insecurity (10/08/2022)   Hunger Vital Sign    Worried About Running Out of Food in the Last Year: Never true    Ran Out of Food in the Last Year: Never true   Transportation Needs: No Transportation Needs (10/08/2022)   PRAPARE - Administrator, Civil Service (Medical): No    Lack of Transportation (Non-Medical): No  Physical Activity: Not on file  Stress: Not on file  Social Connections: Not on file  Intimate Partner Violence: Not At Risk (10/08/2022)   Humiliation, Afraid, Rape, and Kick questionnaire    Fear of Current or Ex-Partner: No    Emotionally Abused: No    Physically Abused: No    Sexually Abused: No     BP 122/78   Pulse 64   Ht 5\' 3"  (1.6 m)   Wt 154 lb (69.9 kg)   SpO2 98%   BMI 27.28 kg/m   Physical Exam:  Well appearing NAD HEENT: Unremarkable Neck:  No JVD, no thyromegally Lymphatics:  No adenopathy Back:  No CVA tenderness Lungs:  Clear with no wheezes HEART:  Regular rate rhythm, no murmurs, no rubs, no clicks Abd:  soft, positive bowel sounds, no organomegally, no rebound, no guarding Ext:  2 plus pulses, no edema, no cyanosis, no clubbing Skin:  No rashes no nodules Neuro:  CN II through XII intact, motor grossly intact  Assess/Plan: Autonomic dysfunction - Her symptoms are atypical, very atypical. However she does have fairly wide fluctuations in his bp. She will continue generous salt intake. Dyspnea - this is her biggest complaint. I have recommended a cardiopulmonary stress test.  HTN - her bp is up at times. I told her to eat more salty foods on days when her bp is down.   Sharlot Gowda Errica Dutil,MD

## 2023-03-04 NOTE — Patient Instructions (Addendum)
Medication Instructions:  Your physician recommends that you continue on your current medications as directed. Please refer to the Current Medication list given to you today.  *If you need a refill on your cardiac medications before your next appointment, please call your pharmacy*  Lab Work: None ordered.  If you have labs (blood work) drawn today and your tests are completely normal, you will receive your results only by: MyChart Message (if you have MyChart) OR A paper copy in the mail If you have any lab test that is abnormal or we need to change your treatment, we will call you to review the results.  Testing/Procedures: Your physician has requested that you have a stress echocardiogram. For further information please visit [email or web address] Please follow instruction sheet as given.   Follow-Up: At [facility], you and your health needs are our priority.  As part of our continuing mission to provide you with exceptional heart care, we have created designated Provider Care Teams.  These Care Teams include your primary Cardiologist (physician) and Advanced Practice Providers (APPs -  Physician Assistants and Nurse Practitioners) who all work together to provide you with the care you need, when you need it.   Your next appointment:   To be determined after your test.     Important Information About Sugar

## 2023-03-07 NOTE — Addendum Note (Signed)
Addended by: Marinus Maw on: 03/07/2023 04:04 PM   Modules accepted: Orders

## 2023-03-10 DIAGNOSIS — G8929 Other chronic pain: Secondary | ICD-10-CM | POA: Diagnosis not present

## 2023-03-10 DIAGNOSIS — M6281 Muscle weakness (generalized): Secondary | ICD-10-CM | POA: Diagnosis not present

## 2023-03-10 DIAGNOSIS — R262 Difficulty in walking, not elsewhere classified: Secondary | ICD-10-CM | POA: Diagnosis not present

## 2023-03-10 DIAGNOSIS — R202 Paresthesia of skin: Secondary | ICD-10-CM | POA: Diagnosis not present

## 2023-03-12 DIAGNOSIS — R262 Difficulty in walking, not elsewhere classified: Secondary | ICD-10-CM | POA: Diagnosis not present

## 2023-03-12 DIAGNOSIS — R202 Paresthesia of skin: Secondary | ICD-10-CM | POA: Diagnosis not present

## 2023-03-12 DIAGNOSIS — G8929 Other chronic pain: Secondary | ICD-10-CM | POA: Diagnosis not present

## 2023-03-12 DIAGNOSIS — M6281 Muscle weakness (generalized): Secondary | ICD-10-CM | POA: Diagnosis not present

## 2023-03-14 NOTE — Progress Notes (Deleted)
[facility] [address] [place] [address] Phone: [phone] Subjective:    I'm seeing this patient by the request  of:  Benita Stabile, MD  CC:   [id number]  01/13/2023 Patient has these abnormal neuralgias and paresthesias.  Seems to have more muscle cramping that is noted as well.  Patient did notice some improvement actually with Valium and given the prescription to have on hand.  In addition of this we did discuss at great length about the possibility of medicine such as Cymbalta that could also be helpful.  Patient is awaiting some other imaging studies from an outside facility in [place].  Depending on the findings we will discuss if this changes any medical management.  I do believe that the images if they are normal we should highly consider the Cymbalta.  We discussed the other ideas of a potential sleep study with an EEG aspect.  The patient has had EEGs previously as well.  Will get 1 more laboratory workup for a anti-GAD with patient recently are reading more about stiff body syndrome and having some signs and symptoms of this as well.  Total time reviewing patient's notes as well as discussing with patient 33 minutes     Update 03/18/2023 Elizabeth Miranda is a 72 y.o. female coming in with complaint of ***  Onset-  Location Duration-  Character- Aggravating factors- Reliving factors-  Therapies tried-  Severity-     Past Medical History:  Diagnosis Date   Essential hypertension 10/08/2022   Hypothyroid    Palpitations    Past Surgical History:  Procedure Laterality Date   APPENDECTOMY     BREAST ENHANCEMENT SURGERY     complete hysterectomy'     PARTIAL HYSTERECTOMY     Social History   Socioeconomic History   Marital status: Married    Spouse name: Not on file   Number of children: Not on file   Years of education: Not on file   Highest education level: Not on file  Occupational History   Not on file  Tobacco  Use   Smoking status: Never   Smokeless tobacco: Never  Substance and Sexual Activity   Alcohol use: No   Drug use: No   Sexual activity: Not on file  Other Topics Concern   Not on file  Social History Narrative   Not on file   Social Determinants of Health   Financial Resource Strain: Not on file  Food Insecurity: No Food Insecurity (10/08/2022)   Hunger Vital Sign    Worried About Running Out of Food in the Last Year: Never true    Ran Out of Food in the Last Year: Never true  Transportation Needs: No Transportation Needs (10/08/2022)   PRAPARE - Administrator, Civil Service (Medical): No    Lack of Transportation (Non-Medical): No  Physical Activity: Not on file  Stress: Not on file  Social Connections: Not on file   Allergies  Allergen Reactions   Codeine     Hives    Sulfa Antibiotics Nausea And Vomiting   Family History  Problem Relation Age of Onset   Emphysema Paternal Grandfather     Current Outpatient Medications (Endocrine & Metabolic):    ESTRADIOL PO, Take 1.5 mg by mouth daily. Compound into a capsule   levothyroxine (SYNTHROID, LEVOTHROID) 100 MCG tablet, Take 112 mcg by mouth daily.   liothyronine (CYTOMEL) 5 MCG tablet, Take 5 mcg by mouth daily.  Current Outpatient Medications (Cardiovascular):    isosorbide mononitrate (IMDUR) 30 MG 24 hr tablet, Take 0.5 tablets (15 mg total) by mouth daily.   propranolol (INDERAL) 10 MG tablet, Take 1 tablet (10 mg total) by mouth 2 (two) times daily.   propranolol (INDERAL) 20 MG tablet, Take 1 tablet (20 mg total) by mouth 2 (two) times daily. (Patient taking differently: Take 20 mg by mouth daily as needed.)   Current Outpatient Medications (Analgesics):    acetaminophen (TYLENOL) 325 MG tablet, Take 2 tablets (650 mg total) by mouth every 6 (six) hours as needed for mild pain, moderate pain or fever.   Rimegepant Sulfate (NURTEC) 75 MG TBDP, Take 1 tablet (75 mg total) by mouth daily as  needed.   Current Outpatient Medications (Other):    acyclovir (ZOVIRAX) 800 MG tablet, Take 800 mg by mouth as needed.   B Complex-C (B-COMPLEX WITH VITAMIN C) tablet, Take 1 tablet by mouth daily.   cholecalciferol (VITAMIN D3) 25 MCG (1000 UNIT) tablet, Take 5,000 Units by mouth daily.   diazepam (VALIUM) 5 MG tablet, Take 0.5 mg by mouth every 6 (six) hours as needed for anxiety.   methocarbamol (ROBAXIN) 750 MG tablet, Take 1 tablet by mouth every 8 (eight) hours as needed.   Multiple Vitamin (MULITIVITAMIN WITH MINERALS) TABS, Take 1 tablet by mouth daily.   nitrofurantoin, macrocrystal-monohydrate, (MACROBID) 100 MG capsule, Take 100 mg by mouth as needed.   Reviewed prior external information including notes and imaging from  primary care provider As well as notes that were available from care everywhere and other healthcare systems.  Past medical history, social, surgical and family history all reviewed in electronic medical record.  No pertanent information unless stated regarding to the chief complaint.   Review of Systems:  No headache, visual changes, nausea, vomiting, diarrhea, constipation, dizziness, abdominal pain, skin rash, fevers, chills, night sweats, weight loss, swollen lymph nodes, body aches, joint swelling, chest pain, shortness of breath, mood changes. POSITIVE muscle aches  Objective  There were no vitals taken for this visit.   General: No apparent distress alert and oriented x3 mood and affect normal, dressed appropriately.  HEENT: Pupils equal, extraocular movements intact  Respiratory: Patient's speak in full sentences and does not appear short of breath  Cardiovascular: No lower extremity edema, non tender, no erythema      Impression and Recommendations:

## 2023-03-16 ENCOUNTER — Emergency Department (HOSPITAL_COMMUNITY): Payer: Medicare Other

## 2023-03-16 ENCOUNTER — Encounter (HOSPITAL_COMMUNITY): Payer: Self-pay | Admitting: Emergency Medicine

## 2023-03-16 ENCOUNTER — Other Ambulatory Visit: Payer: Self-pay

## 2023-03-16 ENCOUNTER — Emergency Department (HOSPITAL_COMMUNITY)
Admission: EM | Admit: 2023-03-16 | Discharge: 2023-03-16 | Disposition: A | Discharge status: Home / Self Care | Payer: Medicare Other | Attending: Emergency Medicine | Admitting: Emergency Medicine

## 2023-03-16 DIAGNOSIS — I10 Essential (primary) hypertension: Secondary | ICD-10-CM | POA: Diagnosis not present

## 2023-03-16 DIAGNOSIS — R6889 Other general symptoms and signs: Secondary | ICD-10-CM | POA: Diagnosis not present

## 2023-03-16 DIAGNOSIS — R531 Weakness: Secondary | ICD-10-CM | POA: Diagnosis not present

## 2023-03-16 DIAGNOSIS — R42 Dizziness and giddiness: Secondary | ICD-10-CM | POA: Diagnosis not present

## 2023-03-16 DIAGNOSIS — Q283 Other malformations of cerebral vessels: Secondary | ICD-10-CM | POA: Diagnosis not present

## 2023-03-16 DIAGNOSIS — R519 Headache, unspecified: Secondary | ICD-10-CM | POA: Diagnosis not present

## 2023-03-16 DIAGNOSIS — I6782 Cerebral ischemia: Secondary | ICD-10-CM | POA: Diagnosis not present

## 2023-03-16 DIAGNOSIS — Z743 Need for continuous supervision: Secondary | ICD-10-CM | POA: Diagnosis not present

## 2023-03-16 LAB — CBC
HCT: 40.3 % (ref 36.0–46.0)
Hemoglobin: 13.1 g/dL (ref 12.0–15.0)
MCH: 29.6 pg (ref 26.0–34.0)
MCHC: 32.5 g/dL (ref 30.0–36.0)
MCV: 91.2 fL (ref 80.0–100.0)
Platelets: 300 10*3/uL (ref 150–400)
RBC: 4.42 MIL/uL (ref 3.87–5.11)
RDW: 12.9 % (ref 11.5–15.5)
WBC: 6 10*3/uL (ref 4.0–10.5)
nRBC: 0 % (ref 0.0–0.2)

## 2023-03-16 LAB — BASIC METABOLIC PANEL
Anion gap: 9 (ref 5–15)
BUN: 16 mg/dL (ref 8–23)
CO2: 23 mmol/L (ref 22–32)
Calcium: 9 mg/dL (ref 8.9–10.3)
Chloride: 104 mmol/L (ref 98–111)
Creatinine, Ser: 0.81 mg/dL (ref 0.44–1.00)
GFR, Estimated: >60 mL/min (ref >60)
Glucose, Bld: 91 mg/dL (ref 70–99)
Potassium: 3.9 mmol/L (ref 3.5–5.1)
Sodium: 136 mmol/L (ref 135–145)

## 2023-03-16 LAB — URINALYSIS, ROUTINE W REFLEX MICROSCOPIC
Bilirubin Urine: NEGATIVE
Glucose, UA: NEGATIVE mg/dL
Hgb urine dipstick: NEGATIVE
Ketones, ur: NEGATIVE mg/dL
Leukocytes,Ua: NEGATIVE
Nitrite: NEGATIVE
Protein, ur: NEGATIVE mg/dL
Specific Gravity, Urine: 1.009 (ref 1.005–1.030)
pH: 6 (ref 5.0–8.0)

## 2023-03-16 LAB — CBG MONITORING, ED: Glucose-Capillary: 90 mg/dL (ref 70–99)

## 2023-03-16 NOTE — ED Notes (Signed)
Patient transported to CT 

## 2023-03-16 NOTE — ED Notes (Addendum)
Pt took Nurtec for headache states that she does not have pain right now unless moving then pain in shoulders also reports headache that started Thursday with pain and tingling in left side 'starting in left foot and radiating into head' similar episodes over the past year affecting right side

## 2023-03-16 NOTE — ED Notes (Signed)
Pt returned from CT. Ambulated to wheelchair and was pushed to the bathroom. Urine sample was collected and sent to lab. Pt in NAD, calm, resp e/u. Family x2 at bedside.

## 2023-03-16 NOTE — ED Provider Notes (Signed)
[place] EMERGENCY DEPARTMENT AT [facility] Provider Note   CSN: [id number] Arrival date & time: 03/16/23  1510     History  Chief Complaint  Patient presents with   Weakness    Elizabeth Miranda is a 72 y.o. female.  Patient currently being followed by [facility] neurology.  Had her first appointment with them on August 30.  Did have a follow-up on September 17.  Prior to that patient was followed in the [place] area.  Patient's been having a lot of neurological kind of symptoms they were thinking may be complicated migraine.  Patient also thinks it could be complications from Lyme's disease.  Patient normally has headaches and weakness usually it is in the right leg.  But today it was left leg.  She did not have any of the usual burning sensation that occurs and moves up but she was napping.  She has been having the symptoms since January.  She does have a headache currently when she arrived but it resolved here without any intervention.  No dizziness.  Past medical history sniffing for hypothyroidism palpitations and hypertension.  Patient has had an appendectomy partial hysterectomy then later had a complete hysterectomy.  Patient does not use any tobacco products.  Patient is very concerned that something new and worse this happen.       Home Medications Prior to Admission medications   Medication Sig Start Date End Date Taking? Authorizing Provider  acetaminophen (TYLENOL) 325 MG tablet Take 2 tablets (650 mg total) by mouth every 6 (six) hours as needed for mild pain, moderate pain or fever. 10/08/22   Shon Hale, MD  acyclovir (ZOVIRAX) 800 MG tablet Take 800 mg by mouth as needed. 12/18/22   [provider]  B Complex-C (B-COMPLEX WITH VITAMIN C) tablet Take 1 tablet by mouth daily.    [provider]  cholecalciferol (VITAMIN D3) 25 MCG (1000 UNIT) tablet Take 5,000 Units by mouth daily.    [provider]  diazepam  (VALIUM) 5 MG tablet Take 0.5 mg by mouth every 6 (six) hours as needed for anxiety.    [provider]  ESTRADIOL PO Take 1.5 mg by mouth daily. Compound into a capsule    [provider]  isosorbide mononitrate (IMDUR) 30 MG 24 hr tablet Take 0.5 tablets (15 mg total) by mouth daily. 10/17/22   [name], MD  levothyroxine (SYNTHROID, LEVOTHROID) 100 MCG tablet Take 112 mcg by mouth daily. 06/29/14   [provider]  liothyronine (CYTOMEL) 5 MCG tablet Take 5 mcg by mouth daily. 01/16/23   [provider]  methocarbamol (ROBAXIN) 750 MG tablet Take 1 tablet by mouth every 8 (eight) hours as needed. 10/21/22   [provider]  Multiple Vitamin (MULITIVITAMIN WITH MINERALS) TABS Take 1 tablet by mouth daily.    [provider]  nitrofurantoin, macrocrystal-monohydrate, (MACROBID) 100 MG capsule Take 100 mg by mouth as needed.    [provider]  propranolol (INDERAL) 10 MG tablet Take 1 tablet (10 mg total) by mouth 2 (two) times daily. 01/20/23   Gaston Islam., NP  propranolol (INDERAL) 20 MG tablet Take 1 tablet (20 mg total) by mouth 2 (two) times daily. Patient taking differently: Take 20 mg by mouth daily as needed. 12/24/22   [name], MD  Rimegepant Sulfate (NURTEC) 75 MG TBDP Take 1 tablet (75 mg total) by mouth daily as needed. 10/14/22   Penumalli, Satira Sark  R, MD      Allergies    Codeine and Sulfa antibiotics    Review of Systems   Review of Systems  Constitutional:  Negative for chills and fever.  HENT:  Negative for ear pain and sore throat.   Eyes:  Negative for pain and visual disturbance.  Respiratory:  Negative for cough and shortness of breath.   Cardiovascular:  Negative for chest pain and palpitations.  Gastrointestinal:  Negative for abdominal pain and vomiting.  Genitourinary:  Negative for dysuria and hematuria.  Musculoskeletal:  Negative for arthralgias and back pain.  Skin:  Negative for  color change and rash.  Neurological:  Positive for weakness and headaches. Negative for seizures and syncope.  All other systems reviewed and are negative.   Physical Exam Updated Vital Signs BP 138/85   Pulse 76   Temp 98.3 F (36.8 C) (Oral)   Resp 13   SpO2 96%  Physical Exam Vitals and nursing note reviewed.  Constitutional:      General: She is not in acute distress.    Appearance: Normal appearance. She is well-developed.  HENT:     Head: Normocephalic and atraumatic.  Eyes:     Extraocular Movements: Extraocular movements intact.     Conjunctiva/sclera: Conjunctivae normal.     Pupils: Pupils are equal, round, and reactive to light.  Cardiovascular:     Rate and Rhythm: Normal rate and regular rhythm.     Heart sounds: No murmur heard. Pulmonary:     Effort: Pulmonary effort is normal. No respiratory distress.     Breath sounds: Normal breath sounds.  Abdominal:     Palpations: Abdomen is soft.     Tenderness: There is no abdominal tenderness.  Musculoskeletal:        General: No swelling.     Cervical back: Normal range of motion and neck supple.  Skin:    General: Skin is warm and dry.     Capillary Refill: Capillary refill takes less than 2 seconds.  Neurological:     Mental Status: She is alert.     Motor: Weakness present.     Comments: Left lower extremity weakness  Psychiatric:        Mood and Affect: Mood normal.     ED Results / Procedures / Treatments   Labs (all labs ordered are listed, but only abnormal results are displayed) Labs Reviewed  URINALYSIS, ROUTINE W REFLEX MICROSCOPIC - Abnormal; Notable for the following components:      Result Value   APPearance HAZY (*)    All other components within normal limits  BASIC METABOLIC PANEL  CBC  CBG MONITORING, ED    EKG EKG Interpretation Date/Time:  Sunday March 16 2023 15:49:07 EDT Ventricular Rate:  66 PR Interval:  186 QRS Duration:  89 QT Interval:  393 QTC  Calculation: 412 R Axis:   107  Text Interpretation: Sinus rhythm Anteroseptal infarct, age indeterminate Minimal ST elevation, inferior leads No significant change since last tracing Confirmed by Vanetta Mulders [phone]) on 03/16/2023 4:00:54 PM  Radiology MR Brain Wo Contrast (neuro protocol)  Result Date: 03/16/2023 CLINICAL DATA:  Neuro deficit, acute, stroke suspected. Headache and weakness. EXAM: MRI HEAD WITHOUT CONTRAST TECHNIQUE: Multiplanar, multiecho pulse sequences of the brain and surrounding structures were obtained without intravenous contrast. COMPARISON:  Head CT same day.  MRI 10/07/2022. FINDINGS: Brain: Diffusion imaging does not show any acute or subacute infarction. No focal abnormality affects the brainstem. No focal cerebellar finding.  Cerebral hemispheres show mild chronic small-vessel ischemic changes of the white matter but no cortical or large vessel territory stroke. A focus of susceptibility in the left occipital lobe probably represents a cavernoma. No cortical or large vessel territory stroke. No evidence of mass lesion, acute hemorrhage, hydrocephalus or extra-axial collection. Vascular: Major vessels at the base of the brain show flow. Skull and upper cervical spine: Negative Sinuses/Orbits: Clear/normal Other: None IMPRESSION: No acute or reversible finding. Mild chronic small-vessel ischemic change of the cerebral hemispheric white matter. 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 Electronically Signed   By: Paulina Fusi M.D.   On: 03/16/2023 18:48   CT Head Wo Contrast  Result Date: 03/16/2023 CLINICAL DATA:  Headache and dizziness. EXAM: CT HEAD WITHOUT CONTRAST TECHNIQUE: Contiguous axial images were obtained from the base of the skull through the vertex without intravenous contrast. RADIATION DOSE REDUCTION: This exam was performed according to the departmental dose-optimization program which includes automated exposure control, adjustment of the mA and/or kV  according to patient size and/or use of iterative reconstruction technique. COMPARISON:  10/07/2022 FINDINGS: Brain: There is no evidence for acute hemorrhage, hydrocephalus, mass lesion, or abnormal extra-axial fluid collection. No definite CT evidence for acute infarction. Vascular: No hyperdense vessel or unexpected calcification. Skull: No evidence for fracture. No worrisome lytic or sclerotic lesion. Sinuses/Orbits: The visualized paranasal sinuses and mastoid air cells are clear. Visualized portions of the globes and intraorbital fat are unremarkable. Other: None. IMPRESSION: Unremarkable head CT. No acute intracranial abnormality. Electronically Signed   By: Kennith Center M.D.   On: 03/16/2023 16:52    Procedures Procedures    Medications Ordered in ED Medications - No data to display  ED Course/ Medical Decision Making/ A&P                                 Medical Decision Making Amount and/or Complexity of Data Reviewed Labs: ordered. Radiology: ordered.  Workup urinalysis negative basic metabolic panel normal.  CBC normal no leukocytosis.  Blood sugar when she first came in was 90.  Did CT head which had no acute findings.  And then proceeded to do MRI because patient was very concerned.  Showed no acute or reversible finding mild chronic small vessel ischemic changes of the cerebral hemispheres white matter.  And a small cavernoma in the left occipital lobe which patient states has been seen in the past.  No acute findings to explain her events.  Patient is little bit resistant to follow back up with [facility] neurology.  So I did give her referral information to [facility] neurology.  Patient is stable and safe for discharge home.  Final Clinical Impression(s) / ED Diagnoses Final diagnoses:  Weakness    Rx / DC Orders ED Discharge Orders     None         Vanetta Mulders, MD 03/16/23 2357

## 2023-03-16 NOTE — ED Notes (Signed)
Patient transported to MRI 

## 2023-03-16 NOTE — ED Notes (Signed)
ED Provider at bedside. 

## 2023-03-16 NOTE — ED Triage Notes (Addendum)
Pt bib [facility] due to weakness. [facility] [place] [address].  Pt c/o left sided weakness 3 days ago. States got worse at lunch time today. Pt a/o. Color wnl. See [facility]. Pt has multiple neurological hx. Symptoms include weakness, headaches, pain with movement to neck and shoulder and more.  Just in [place] 2 months ago and they did an MRI of head and wanted her to get with neurologist due to swelling on the brain per pt. Pt has been having these symptoms since January but only to right side. Now starting with the left side.  C/o headache at present. Denies dizziness.

## 2023-03-16 NOTE — ED Notes (Signed)
Pt returned from MRI °

## 2023-03-16 NOTE — ED Notes (Addendum)
Pt reports left thigh spasms rated 4/10. Reported that movement makes it worse, but is okay when not moving.

## 2023-03-16 NOTE — Discharge Instructions (Addendum)
Follow back up with [facility] neurology and/or also did give you referral information about [facility] neurology.  Today's workup without any acute findings.  Including CT head MRI brain.  Also recommend following back up with your primary care doctor.

## 2023-03-18 ENCOUNTER — Ambulatory Visit: Payer: Medicare Other | Admitting: Family Medicine

## 2023-03-19 DIAGNOSIS — E039 Hypothyroidism, unspecified: Secondary | ICD-10-CM | POA: Diagnosis not present

## 2023-03-19 DIAGNOSIS — I10 Essential (primary) hypertension: Secondary | ICD-10-CM | POA: Diagnosis not present

## 2023-03-19 DIAGNOSIS — R202 Paresthesia of skin: Secondary | ICD-10-CM | POA: Diagnosis not present

## 2023-03-19 DIAGNOSIS — I629 Nontraumatic intracranial hemorrhage, unspecified: Secondary | ICD-10-CM | POA: Diagnosis not present

## 2023-03-19 DIAGNOSIS — R002 Palpitations: Secondary | ICD-10-CM | POA: Diagnosis not present

## 2023-03-19 DIAGNOSIS — R0609 Other forms of dyspnea: Secondary | ICD-10-CM | POA: Diagnosis not present

## 2023-03-19 DIAGNOSIS — G43009 Migraine without aura, not intractable, without status migrainosus: Secondary | ICD-10-CM | POA: Diagnosis not present

## 2023-03-19 DIAGNOSIS — R0789 Other chest pain: Secondary | ICD-10-CM | POA: Diagnosis not present

## 2023-03-24 ENCOUNTER — Telehealth (HOSPITAL_COMMUNITY): Payer: Self-pay

## 2023-03-24 NOTE — Telephone Encounter (Signed)
Detailed instructions left on the patient's answering machine. Asked to call back with any questions. S.Aby Gessel CCT

## 2023-03-26 ENCOUNTER — Ambulatory Visit (HOSPITAL_COMMUNITY): Payer: Medicare Other

## 2023-03-26 ENCOUNTER — Other Ambulatory Visit: Payer: Self-pay

## 2023-03-26 ENCOUNTER — Encounter (HOSPITAL_COMMUNITY): Payer: Self-pay

## 2023-03-26 DIAGNOSIS — R0600 Dyspnea, unspecified: Secondary | ICD-10-CM

## 2023-03-31 ENCOUNTER — Other Ambulatory Visit (HOSPITAL_COMMUNITY): Payer: Self-pay | Admitting: *Deleted

## 2023-03-31 ENCOUNTER — Ambulatory Visit (HOSPITAL_COMMUNITY): Payer: Medicare Other | Attending: Cardiology

## 2023-03-31 DIAGNOSIS — I10 Essential (primary) hypertension: Secondary | ICD-10-CM | POA: Diagnosis not present

## 2023-03-31 DIAGNOSIS — R002 Palpitations: Secondary | ICD-10-CM | POA: Diagnosis not present

## 2023-03-31 DIAGNOSIS — E039 Hypothyroidism, unspecified: Secondary | ICD-10-CM | POA: Diagnosis not present

## 2023-03-31 DIAGNOSIS — R0609 Other forms of dyspnea: Secondary | ICD-10-CM | POA: Insufficient documentation

## 2023-03-31 DIAGNOSIS — R0600 Dyspnea, unspecified: Secondary | ICD-10-CM | POA: Diagnosis not present

## 2023-03-31 DIAGNOSIS — G909 Disorder of the autonomic nervous system, unspecified: Secondary | ICD-10-CM | POA: Diagnosis not present

## 2023-04-03 DIAGNOSIS — R252 Cramp and spasm: Secondary | ICD-10-CM | POA: Diagnosis not present

## 2023-05-09 NOTE — Progress Notes (Unsigned)
[facility] [address] [place] [address] Phone: [phone] Subjective:    I'm seeing this patient by the request  of:  Benita Stabile, MD  CC:   [id number]  01/13/2023 Patient has these abnormal neuralgias and paresthesias.  Seems to have more muscle cramping that is noted as well.  Patient did notice some improvement actually with Valium and given the prescription to have on hand.  In addition of this we did discuss at great length about the possibility of medicine such as Cymbalta that could also be helpful.  Patient is awaiting some other imaging studies from an outside facility in [place].  Depending on the findings we will discuss if this changes any medical management.  I do believe that the images if they are normal we should highly consider the Cymbalta.  We discussed the other ideas of a potential sleep study with an EEG aspect.  The patient has had EEGs previously as well.  Will get 1 more laboratory workup for a anti-GAD with patient recently are reading more about stiff body syndrome and having some signs and symptoms of this as well.  Total time reviewing patient's notes as well as discussing with patient 33 minutes     Updated 05/19/2023 LELANIA DYKE is a 72 y.o. female coming in with complaint of polyarthralgia.       Past Medical History:  Diagnosis Date   Essential hypertension 10/08/2022   Hypothyroid    Palpitations    Past Surgical History:  Procedure Laterality Date   APPENDECTOMY     BREAST ENHANCEMENT SURGERY     complete hysterectomy'     PARTIAL HYSTERECTOMY     Social History   Socioeconomic History   Marital status: Married    Spouse name: Not on file   Number of children: Not on file   Years of education: Not on file   Highest education level: Not on file  Occupational History   Not on file  Tobacco Use   Smoking status: Never   Smokeless tobacco: Never  Substance and Sexual Activity   Alcohol  use: No   Drug use: No   Sexual activity: Not on file  Other Topics Concern   Not on file  Social History Narrative   Not on file   Social Determinants of Health   Financial Resource Strain: Not on file  Food Insecurity: No Food Insecurity (10/08/2022)   Hunger Vital Sign    Worried About Running Out of Food in the Last Year: Never true    Ran Out of Food in the Last Year: Never true  Transportation Needs: No Transportation Needs (10/08/2022)   PRAPARE - Administrator, Civil Service (Medical): No    Lack of Transportation (Non-Medical): No  Physical Activity: Not on file  Stress: Not on file  Social Connections: Not on file   Allergies  Allergen Reactions   Codeine     Hives    Sulfa Antibiotics Nausea And Vomiting   Family History  Problem Relation Age of Onset   Emphysema Paternal Grandfather     Current Outpatient Medications (Endocrine & Metabolic):    ESTRADIOL PO, Take 1.5 mg by mouth daily. Compound into a capsule   levothyroxine (SYNTHROID, LEVOTHROID) 100 MCG tablet, Take 112 mcg by mouth daily.   liothyronine (CYTOMEL) 5 MCG tablet, Take 5 mcg by mouth daily.  Current Outpatient Medications (Cardiovascular):    isosorbide mononitrate (IMDUR) 30 MG 24  hr tablet, Take 0.5 tablets (15 mg total) by mouth daily.   propranolol (INDERAL) 10 MG tablet, Take 1 tablet (10 mg total) by mouth 2 (two) times daily.   propranolol (INDERAL) 20 MG tablet, Take 1 tablet (20 mg total) by mouth 2 (two) times daily. (Patient taking differently: Take 20 mg by mouth daily as needed.)   Current Outpatient Medications (Analgesics):    acetaminophen (TYLENOL) 325 MG tablet, Take 2 tablets (650 mg total) by mouth every 6 (six) hours as needed for mild pain, moderate pain or fever.   Rimegepant Sulfate (NURTEC) 75 MG TBDP, Take 1 tablet (75 mg total) by mouth daily as needed.   Current Outpatient Medications (Other):    acyclovir (ZOVIRAX) 800 MG tablet, Take 800 mg by  mouth as needed.   B Complex-C (B-COMPLEX WITH VITAMIN C) tablet, Take 1 tablet by mouth daily.   cholecalciferol (VITAMIN D3) 25 MCG (1000 UNIT) tablet, Take 5,000 Units by mouth daily.   diazepam (VALIUM) 5 MG tablet, Take 0.5 mg by mouth every 6 (six) hours as needed for anxiety.   methocarbamol (ROBAXIN) 750 MG tablet, Take 1 tablet by mouth every 8 (eight) hours as needed.   Multiple Vitamin (MULITIVITAMIN WITH MINERALS) TABS, Take 1 tablet by mouth daily.   nitrofurantoin, macrocrystal-monohydrate, (MACROBID) 100 MG capsule, Take 100 mg by mouth as needed.   Reviewed prior external information including notes and imaging from  primary care provider As well as notes that were available from care everywhere and other healthcare systems.  Past medical history, social, surgical and family history all reviewed in electronic medical record.  No pertanent information unless stated regarding to the chief complaint.   Review of Systems:  No headache, visual changes, nausea, vomiting, diarrhea, constipation, dizziness, abdominal pain, skin rash, fevers, chills, night sweats, weight loss, swollen lymph nodes, body aches, joint swelling, chest pain, shortness of breath, mood changes. POSITIVE muscle aches  Objective  There were no vitals taken for this visit.   General: No apparent distress alert and oriented x3 mood and affect normal, dressed appropriately.  HEENT: Pupils equal, extraocular movements intact  Respiratory: Patient's speak in full sentences and does not appear short of breath  Cardiovascular: No lower extremity edema, non tender, no erythema      Impression and Recommendations:

## 2023-05-19 ENCOUNTER — Ambulatory Visit: Payer: Medicare Other | Admitting: Family Medicine

## 2023-05-21 DIAGNOSIS — G35 Multiple sclerosis: Secondary | ICD-10-CM | POA: Diagnosis not present

## 2023-05-21 DIAGNOSIS — G919 Hydrocephalus, unspecified: Secondary | ICD-10-CM | POA: Diagnosis not present

## 2023-05-21 DIAGNOSIS — G309 Alzheimer's disease, unspecified: Secondary | ICD-10-CM | POA: Diagnosis not present

## 2023-06-13 DIAGNOSIS — R0609 Other forms of dyspnea: Secondary | ICD-10-CM | POA: Diagnosis not present

## 2023-06-13 DIAGNOSIS — R202 Paresthesia of skin: Secondary | ICD-10-CM | POA: Diagnosis not present

## 2023-06-13 DIAGNOSIS — E039 Hypothyroidism, unspecified: Secondary | ICD-10-CM | POA: Diagnosis not present

## 2023-06-13 DIAGNOSIS — J069 Acute upper respiratory infection, unspecified: Secondary | ICD-10-CM | POA: Diagnosis not present

## 2023-06-13 DIAGNOSIS — I629 Nontraumatic intracranial hemorrhage, unspecified: Secondary | ICD-10-CM | POA: Diagnosis not present

## 2023-06-13 DIAGNOSIS — I10 Essential (primary) hypertension: Secondary | ICD-10-CM | POA: Diagnosis not present

## 2023-06-13 DIAGNOSIS — R002 Palpitations: Secondary | ICD-10-CM | POA: Diagnosis not present

## 2023-06-13 DIAGNOSIS — G43009 Migraine without aura, not intractable, without status migrainosus: Secondary | ICD-10-CM | POA: Diagnosis not present

## 2023-06-20 DIAGNOSIS — R208 Other disturbances of skin sensation: Secondary | ICD-10-CM | POA: Diagnosis not present

## 2023-06-20 DIAGNOSIS — G4489 Other headache syndrome: Secondary | ICD-10-CM | POA: Diagnosis not present

## 2023-06-20 DIAGNOSIS — R9089 Other abnormal findings on diagnostic imaging of central nervous system: Secondary | ICD-10-CM | POA: Diagnosis not present

## 2023-06-20 DIAGNOSIS — R252 Cramp and spasm: Secondary | ICD-10-CM | POA: Diagnosis not present

## 2023-06-20 DIAGNOSIS — R269 Unspecified abnormalities of gait and mobility: Secondary | ICD-10-CM | POA: Diagnosis not present

## 2023-07-01 ENCOUNTER — Other Ambulatory Visit (HOSPITAL_COMMUNITY): Payer: Self-pay | Admitting: Endocrinology

## 2023-07-01 DIAGNOSIS — R911 Solitary pulmonary nodule: Secondary | ICD-10-CM

## 2023-07-18 ENCOUNTER — Ambulatory Visit (HOSPITAL_COMMUNITY)
Admission: RE | Admit: 2023-07-18 | Discharge: 2023-07-18 | Disposition: A | Discharge status: Home / Self Care | Payer: Medicare Other | Source: Ambulatory Visit | Attending: Endocrinology | Admitting: Endocrinology

## 2023-07-18 DIAGNOSIS — I700 Atherosclerosis of aorta: Secondary | ICD-10-CM | POA: Diagnosis not present

## 2023-07-18 DIAGNOSIS — R911 Solitary pulmonary nodule: Secondary | ICD-10-CM | POA: Insufficient documentation

## 2023-07-18 DIAGNOSIS — R918 Other nonspecific abnormal finding of lung field: Secondary | ICD-10-CM | POA: Diagnosis not present

## 2023-07-23 ENCOUNTER — Ambulatory Visit (HOSPITAL_BASED_OUTPATIENT_CLINIC_OR_DEPARTMENT_OTHER): Payer: Medicare Other | Admitting: Cardiology

## 2023-07-25 DIAGNOSIS — R059 Cough, unspecified: Secondary | ICD-10-CM | POA: Diagnosis not present

## 2023-07-25 DIAGNOSIS — R070 Pain in throat: Secondary | ICD-10-CM | POA: Diagnosis not present

## 2023-07-25 DIAGNOSIS — Z20822 Contact with and (suspected) exposure to covid-19: Secondary | ICD-10-CM | POA: Diagnosis not present

## 2023-07-25 DIAGNOSIS — J069 Acute upper respiratory infection, unspecified: Secondary | ICD-10-CM | POA: Diagnosis not present

## 2023-09-01 NOTE — Progress Notes (Unsigned)
 Cardiology Office Note    Patient Name: Elizabeth Miranda Date of Encounter: 09/02/2023  Primary Care Provider:  Benita Stabile, MD Primary Cardiologist:  None Primary Electrophysiologist: None   Past Medical History    Past Medical History:  Diagnosis Date   Essential hypertension 10/08/2022   Hypothyroid    Palpitations     History of Present Illness  Elizabeth Miranda  is a 73 year old female with a PMH of palpitations, DOE, hypothyroidism, HTN left occipital ICH who presents today for 79-month follow-up.  Elizabeth Miranda was last seen on 01/20/2023 for 21-month follow-up.  During that visit she reported improvement to her palpitations and chest discomfort since starting Imdur.  She reported ongoing discomfort with headaches shortness of breath when lifting her arms above her head.  She had previously completed a full body scan in [place] by a health advocate.  She was found to have stable blood pressures.  She also has experienced episodes of dysautonomia and tachycardia with low blood pressure in the past.  During her visit she has stable blood pressure 114/64.  She was referred to EP to discuss possible dysautonomia.  She was also referred to [facility] due to dystonia.  She was seen by Dr. Ladona Ridgel on 03/04/2023.  She was advised to be liberal with salt intake with recommendations to undergo stress test due to dyspnea.  She was seen in the ED on 03/16/2023 with complaint of weakness.  She underwent workup with no acute findings and was evaluated with consultation on 02/14/2023 by [facility].  She underwent an EEG study that was normal.  She was she was also referred to [facility] neurology and was seen by the [facility] infectious disease [facility].  She was also referred to Dr. Sheldon [place] at [facility] in [facility].  When a lumbar puncture and was started on Diamox. Dr. Swazil[place] started her on valganciclovir 450 mg once daily, reportedly to treat her for  the possibility of chronic Epstein-Barr virus infection.   Elizabeth Miranda presents today for follow-up and has been doing much better since her previous visit. She reports significant improvement in her condition, with a decrease in palpitations and improvement in balance and coordination. She attributes this improvement to a regimen of Diamox and valganciclovir, prescribed by a specialist in [place]. However, she also reports significant side effects from these medications, including dehydration, hair loss, and cavities. She has stopped these medications since the end of February and is now on a cyclic regimen of Diamox with potassium and aciclovir, as directed by her specialist. The patient also reports weight loss, which she believes is due to decreased inflammation. However, she has noticed an increase in fluid retention, particularly in her legs, since stopping the Diamox. She has tried using compression socks to manage this symptom. She also reports a history of high cholesterol and triglycerides, and has previously tried Crestor, but stopped due to joint pain. She is considering trying other statins or possibly Repatha. Patient denies chest pain, palpitations, dyspnea, PND, orthopnea, nausea, vomiting, dizziness, syncope, edema, weight gain, or early satiety.  Review of Systems  Please see the history of present illness.    All other systems reviewed and are otherwise negative except as noted above.  Physical Exam    Wt Readings from Last 3 Encounters:  09/02/23 144 lb 12.8 oz (65.7 kg)  03/04/23 154 lb (69.9 kg)  01/20/23 155 lb 6.4 oz (70.5 kg)   VS: Vitals:  09/02/23 0851  BP: 136/79  Pulse: 70  SpO2: 98%  ,Body mass index is 25.65 kg/m. GEN: Well nourished, well developed in no acute distress Neck: No JVD; No carotid bruits Pulmonary: Clear to auscultation without rales, wheezing or rhonchi  Cardiovascular: Normal rate. Regular rhythm. Normal S1. Normal S2.   Murmurs: There is  no murmur.  ABDOMEN: Soft, non-tender, non-distended EXTREMITIES:  No edema; No deformity   EKG/LABS/ Recent Cardiac Studies   ECG personally reviewed by me today -none completed today  Risk Assessment/Calculations:          Lab Results  Component Value Date   WBC 6.0 03/16/2023   HGB 13.1 03/16/2023   HCT 40.3 03/16/2023   MCV 91.2 03/16/2023   PLT 300 03/16/2023   Lab Results  Component Value Date   CREATININE 0.81 03/16/2023   BUN 16 03/16/2023   NA 136 03/16/2023   K 3.9 03/16/2023   CL 104 03/16/2023   CO2 23 03/16/2023   Lab Results  Component Value Date   CHOL 187 10/07/2022   HDL 42 10/07/2022   LDLCALC 99 10/07/2022   TRIG 229 (H) 10/07/2022   CHOLHDL 4.5 10/07/2022    Lab Results  Component Value Date   HGBA1C 5.7 (H) 10/07/2022   Assessment & Plan    1. Essential hypertension: -Patient's blood pressure today was stable at 136/79 -Propranolol was changed to as needed 10 mg and Imdur was also switched to 50 mg as needed  2.  Hypothyroidism: -Currently managed by PCP -Continue Synthroid 112 mcg daily  3.Dyspnea on exertion:  -Patient reports improvement since being on acyclovir and as needed diet Diamox.  4.  Palpitations: -Intermittent palpitations improved with paused Imdur and propranolol, now used as needed. Reports less fatigue. - Use Imdur 15 mg as needed for chest discomfort. - Use propranolol 10 mg as needed for palpitations.  5. Hydrocephalus: -Chronic hydrocephalus secondary to myelitis and spinal cord inflammation. Managed with cyclic Diamox, potassium, and aciclovir, improving balance and coordination. - Continue cyclic treatment with Diamox and potassium for one week per month. - Administer aciclovir 800 mg once daily for one week per month  Disposition: Follow-up with None or APP in 7 months    Signed, Napoleon Form, Leodis Rains, NP 09/02/2023, 9:43 AM  [facility]

## 2023-09-02 ENCOUNTER — Encounter: Payer: Self-pay | Admitting: Nurse Practitioner

## 2023-09-02 ENCOUNTER — Ambulatory Visit: Attending: Nurse Practitioner | Admitting: Nurse Practitioner

## 2023-09-02 VITALS — BP 136/79 | HR 70 | Ht 63.0 in | Wt 144.8 lb

## 2023-09-02 DIAGNOSIS — R002 Palpitations: Secondary | ICD-10-CM | POA: Diagnosis not present

## 2023-09-02 DIAGNOSIS — R0609 Other forms of dyspnea: Secondary | ICD-10-CM

## 2023-09-02 DIAGNOSIS — E039 Hypothyroidism, unspecified: Secondary | ICD-10-CM | POA: Diagnosis not present

## 2023-09-02 DIAGNOSIS — G249 Dystonia, unspecified: Secondary | ICD-10-CM | POA: Diagnosis not present

## 2023-09-02 DIAGNOSIS — R079 Chest pain, unspecified: Secondary | ICD-10-CM | POA: Diagnosis not present

## 2023-09-02 DIAGNOSIS — M792 Neuralgia and neuritis, unspecified: Secondary | ICD-10-CM | POA: Diagnosis not present

## 2023-09-02 DIAGNOSIS — G919 Hydrocephalus, unspecified: Secondary | ICD-10-CM

## 2023-09-02 DIAGNOSIS — I2510 Atherosclerotic heart disease of native coronary artery without angina pectoris: Secondary | ICD-10-CM | POA: Diagnosis not present

## 2023-09-02 DIAGNOSIS — I10 Essential (primary) hypertension: Secondary | ICD-10-CM | POA: Diagnosis not present

## 2023-09-02 MED ORDER — ISOSORBIDE MONONITRATE ER 30 MG PO TB24
15.0000 mg | ORAL_TABLET | Freq: Every day | ORAL | Status: DC | PRN
Start: 2023-09-02 — End: 2023-09-25

## 2023-09-02 MED ORDER — PROPRANOLOL HCL 10 MG PO TABS: 10.0000 mg | ORAL_TABLET | Freq: Every day | ORAL | 0 refills | Status: AC | PRN

## 2023-09-02 MED ORDER — FUROSEMIDE 20 MG PO TABS: 20.0000 mg | ORAL_TABLET | Freq: Every day | ORAL | 0 refills | Status: AC | PRN

## 2023-09-02 NOTE — Patient Instructions (Signed)
 Medication Instructions:  CHANGE Imdur to 15mg  once a day as needed CHANGE Propranol 10mg  once a day as needed CHANGE Lasix 20mg  Take 1 tablet daily as needed *If you need a refill on your cardiac medications before your next appointment, please call your pharmacy*   Lab Work: None ordered If you have labs (blood work) drawn today and your tests are completely normal, you will receive your results only by: MyChart Message (if you have MyChart) OR A paper copy in the mail If you have any lab test that is abnormal or we need to change your treatment, we will call you to review the results.   Testing/Procedures: None ordered   Follow-Up: At [facility], you and your health needs are our priority.  As part of our continuing mission to provide you with exceptional heart care, we have created designated Provider Care Teams.  These Care Teams include your primary Cardiologist (physician) and Advanced Practice Providers (APPs -  Physician Assistants and Nurse Practitioners) who all work together to provide you with the care you need, when you need it.  We recommend signing up for the patient portal called "MyChart".  Sign up information is provided on this After Visit Summary.  MyChart is used to connect with patients for Virtual Visits (Telemedicine).  Patients are able to view lab/test results, encounter notes, upcoming appointments, etc.  Non-urgent messages can be sent to your provider as well.   To learn more about what you can do with MyChart, go to [email or web address].    Your next appointment:   7 month(s)  Provider:   Jodelle Red, MD    Other Instructions

## 2023-09-23 DIAGNOSIS — E039 Hypothyroidism, unspecified: Secondary | ICD-10-CM | POA: Diagnosis not present

## 2023-09-23 DIAGNOSIS — G43909 Migraine, unspecified, not intractable, without status migrainosus: Secondary | ICD-10-CM | POA: Diagnosis not present

## 2023-09-23 DIAGNOSIS — G43009 Migraine without aura, not intractable, without status migrainosus: Secondary | ICD-10-CM | POA: Diagnosis not present

## 2023-09-23 DIAGNOSIS — R0789 Other chest pain: Secondary | ICD-10-CM | POA: Diagnosis not present

## 2023-09-23 DIAGNOSIS — E785 Hyperlipidemia, unspecified: Secondary | ICD-10-CM | POA: Diagnosis not present

## 2023-09-23 DIAGNOSIS — G919 Hydrocephalus, unspecified: Secondary | ICD-10-CM | POA: Diagnosis not present

## 2023-09-23 DIAGNOSIS — I700 Atherosclerosis of aorta: Secondary | ICD-10-CM | POA: Diagnosis not present

## 2023-09-23 DIAGNOSIS — R002 Palpitations: Secondary | ICD-10-CM | POA: Diagnosis not present

## 2023-09-23 DIAGNOSIS — I10 Essential (primary) hypertension: Secondary | ICD-10-CM | POA: Diagnosis not present

## 2023-09-23 DIAGNOSIS — I629 Nontraumatic intracranial hemorrhage, unspecified: Secondary | ICD-10-CM | POA: Diagnosis not present

## 2023-09-23 DIAGNOSIS — R202 Paresthesia of skin: Secondary | ICD-10-CM | POA: Diagnosis not present

## 2023-09-23 DIAGNOSIS — R0609 Other forms of dyspnea: Secondary | ICD-10-CM | POA: Diagnosis not present

## 2023-09-25 ENCOUNTER — Other Ambulatory Visit: Payer: Self-pay

## 2023-09-25 ENCOUNTER — Telehealth: Payer: Self-pay

## 2023-09-25 DIAGNOSIS — R079 Chest pain, unspecified: Secondary | ICD-10-CM

## 2023-09-25 DIAGNOSIS — I2510 Atherosclerotic heart disease of native coronary artery without angina pectoris: Secondary | ICD-10-CM

## 2023-09-25 DIAGNOSIS — M792 Neuralgia and neuritis, unspecified: Secondary | ICD-10-CM

## 2023-09-25 MED ORDER — ISOSORBIDE MONONITRATE ER 30 MG PO TB24: 15.0000 mg | ORAL_TABLET | Freq: Every day | ORAL | 3 refills | Status: AC | PRN

## 2023-09-25 NOTE — Progress Notes (Signed)
   09/25/2023  Patient ID: Elizabeth Miranda, female   DOB: Oct 30, 1950, 73 y.o.   MRN: [id number]  Patient appeared on insurance report for not passing the quality metrics in 2024:  Medication Adherence for Diabetes (MAD) Medication Adherence for Hypertension [facility])   Outreach to the patient was not needed today.  Meds Tracking:  -Livalo 2 mg - Last filled 90DS on 09/04/23 (first fill), LDL 120 on 08/08/23. Has not qualified for metric yet, next fill due 12/03/23.  -No longer on losartan, taking propranolol and imdur, managed by cardiology. Will not qualify for this metric unless meds are changed.  Plan:  Review fill history for Livalo on 12/08/23, if no fill outreach patient to discuss. If having myalgias need to add exclusion code for [facility].   Fayette Pho, PharmD

## 2023-10-13 ENCOUNTER — Encounter: Payer: Self-pay | Admitting: *Deleted

## 2023-10-13 NOTE — Progress Notes (Signed)
 In office with husband who saw Londa Rival and request to follow [facility]. Per Londa Rival, okay to switch to him as her PCP cardiologist.

## 2023-10-29 ENCOUNTER — Other Ambulatory Visit (HOSPITAL_COMMUNITY): Payer: Self-pay | Admitting: Internal Medicine

## 2023-10-29 DIAGNOSIS — G4700 Insomnia, unspecified: Secondary | ICD-10-CM | POA: Diagnosis not present

## 2023-10-29 DIAGNOSIS — R202 Paresthesia of skin: Secondary | ICD-10-CM

## 2023-10-29 DIAGNOSIS — M792 Neuralgia and neuritis, unspecified: Secondary | ICD-10-CM | POA: Diagnosis not present

## 2023-10-29 DIAGNOSIS — G43009 Migraine without aura, not intractable, without status migrainosus: Secondary | ICD-10-CM | POA: Diagnosis not present

## 2023-10-29 DIAGNOSIS — I10 Essential (primary) hypertension: Secondary | ICD-10-CM | POA: Diagnosis not present

## 2023-10-29 DIAGNOSIS — E785 Hyperlipidemia, unspecified: Secondary | ICD-10-CM | POA: Diagnosis not present

## 2023-10-29 DIAGNOSIS — Z79899 Other long term (current) drug therapy: Secondary | ICD-10-CM | POA: Diagnosis not present

## 2023-10-31 ENCOUNTER — Ambulatory Visit (HOSPITAL_BASED_OUTPATIENT_CLINIC_OR_DEPARTMENT_OTHER): Payer: Medicare Other | Admitting: Cardiology

## 2023-11-04 ENCOUNTER — Ambulatory Visit (HOSPITAL_COMMUNITY)
Admission: RE | Admit: 2023-11-04 | Discharge: 2023-11-04 | Disposition: A | Discharge status: Home / Self Care | Source: Ambulatory Visit | Attending: Internal Medicine | Admitting: Internal Medicine

## 2023-11-04 DIAGNOSIS — R202 Paresthesia of skin: Secondary | ICD-10-CM | POA: Diagnosis not present

## 2023-11-04 DIAGNOSIS — I6782 Cerebral ischemia: Secondary | ICD-10-CM | POA: Diagnosis not present

## 2023-11-04 DIAGNOSIS — R531 Weakness: Secondary | ICD-10-CM | POA: Diagnosis not present

## 2023-11-20 ENCOUNTER — Other Ambulatory Visit (HOSPITAL_COMMUNITY): Payer: Self-pay | Admitting: Family Medicine

## 2023-11-20 DIAGNOSIS — M545 Low back pain, unspecified: Secondary | ICD-10-CM

## 2023-11-20 DIAGNOSIS — R300 Dysuria: Secondary | ICD-10-CM | POA: Diagnosis not present

## 2023-11-21 ENCOUNTER — Ambulatory Visit (HOSPITAL_COMMUNITY)
Admission: RE | Admit: 2023-11-21 | Discharge: 2023-11-21 | Disposition: A | Discharge status: Home / Self Care | Source: Ambulatory Visit | Attending: Family Medicine | Admitting: Family Medicine

## 2023-11-21 DIAGNOSIS — M47816 Spondylosis without myelopathy or radiculopathy, lumbar region: Secondary | ICD-10-CM | POA: Diagnosis not present

## 2023-11-21 DIAGNOSIS — M545 Low back pain, unspecified: Secondary | ICD-10-CM | POA: Insufficient documentation

## 2023-11-21 DIAGNOSIS — Z87442 Personal history of urinary calculi: Secondary | ICD-10-CM | POA: Diagnosis not present

## 2023-11-21 DIAGNOSIS — K5900 Constipation, unspecified: Secondary | ICD-10-CM | POA: Diagnosis not present

## 2023-12-04 ENCOUNTER — Encounter (HOSPITAL_COMMUNITY): Payer: Self-pay | Admitting: Internal Medicine

## 2023-12-05 ENCOUNTER — Other Ambulatory Visit (HOSPITAL_COMMUNITY): Payer: Self-pay | Admitting: Internal Medicine

## 2023-12-05 DIAGNOSIS — R202 Paresthesia of skin: Secondary | ICD-10-CM

## 2023-12-08 ENCOUNTER — Telehealth: Payer: Self-pay

## 2023-12-08 NOTE — Telephone Encounter (Signed)
 Up to date on meds, next review in early July, check statin status

## 2023-12-12 ENCOUNTER — Ambulatory Visit (HOSPITAL_COMMUNITY)
Admission: RE | Admit: 2023-12-12 | Discharge: 2023-12-12 | Disposition: A | Discharge status: Home / Self Care | Source: Ambulatory Visit | Attending: Internal Medicine | Admitting: Internal Medicine

## 2023-12-12 ENCOUNTER — Other Ambulatory Visit (HOSPITAL_COMMUNITY): Payer: Self-pay | Admitting: Internal Medicine

## 2023-12-12 DIAGNOSIS — R202 Paresthesia of skin: Secondary | ICD-10-CM | POA: Insufficient documentation

## 2023-12-12 DIAGNOSIS — M47814 Spondylosis without myelopathy or radiculopathy, thoracic region: Secondary | ICD-10-CM | POA: Diagnosis not present

## 2023-12-12 DIAGNOSIS — M4802 Spinal stenosis, cervical region: Secondary | ICD-10-CM | POA: Diagnosis not present

## 2023-12-12 DIAGNOSIS — M51369 Other intervertebral disc degeneration, lumbar region without mention of lumbar back pain or lower extremity pain: Secondary | ICD-10-CM | POA: Diagnosis not present

## 2023-12-12 DIAGNOSIS — M48061 Spinal stenosis, lumbar region without neurogenic claudication: Secondary | ICD-10-CM | POA: Diagnosis not present

## 2023-12-12 DIAGNOSIS — G919 Hydrocephalus, unspecified: Secondary | ICD-10-CM | POA: Diagnosis not present

## 2023-12-12 DIAGNOSIS — M5125 Other intervertebral disc displacement, thoracolumbar region: Secondary | ICD-10-CM | POA: Diagnosis not present

## 2023-12-12 DIAGNOSIS — M5134 Other intervertebral disc degeneration, thoracic region: Secondary | ICD-10-CM | POA: Diagnosis not present

## 2023-12-12 DIAGNOSIS — M47812 Spondylosis without myelopathy or radiculopathy, cervical region: Secondary | ICD-10-CM | POA: Diagnosis not present

## 2023-12-12 DIAGNOSIS — M5126 Other intervertebral disc displacement, lumbar region: Secondary | ICD-10-CM | POA: Diagnosis not present

## 2023-12-12 DIAGNOSIS — M5030 Other cervical disc degeneration, unspecified cervical region: Secondary | ICD-10-CM | POA: Diagnosis not present

## 2023-12-12 DIAGNOSIS — R531 Weakness: Secondary | ICD-10-CM | POA: Diagnosis not present

## 2023-12-12 DIAGNOSIS — M47816 Spondylosis without myelopathy or radiculopathy, lumbar region: Secondary | ICD-10-CM | POA: Diagnosis not present

## 2023-12-12 DIAGNOSIS — M4184 Other forms of scoliosis, thoracic region: Secondary | ICD-10-CM | POA: Diagnosis not present

## 2023-12-12 DIAGNOSIS — M419 Scoliosis, unspecified: Secondary | ICD-10-CM | POA: Diagnosis not present

## 2023-12-12 MED ORDER — GADOBUTROL 1 MMOL/ML IV SOLN
6.0000 mL | Freq: Once | INTRAVENOUS | Status: AC | PRN
  Administered 2023-12-12: 6 mL via INTRAVENOUS

## 2023-12-18 ENCOUNTER — Telehealth: Payer: Self-pay

## 2023-12-18 NOTE — Telephone Encounter (Signed)
 No longer on a statin, switched to repatha. No need to continue following adherence

## 2023-12-25 ENCOUNTER — Other Ambulatory Visit (HOSPITAL_COMMUNITY): Payer: Self-pay | Admitting: Cardiology

## 2023-12-25 ENCOUNTER — Ambulatory Visit (HOSPITAL_COMMUNITY)
Admission: RE | Admit: 2023-12-25 | Discharge: 2023-12-25 | Disposition: A | Discharge status: Home / Self Care | Source: Ambulatory Visit | Attending: Cardiology | Admitting: Cardiology

## 2023-12-25 ENCOUNTER — Encounter (HOSPITAL_COMMUNITY): Payer: Self-pay

## 2023-12-25 ENCOUNTER — Ambulatory Visit (HOSPITAL_COMMUNITY): Admit: 2023-12-25 | Admitting: Cardiology

## 2023-12-25 DIAGNOSIS — I2510 Atherosclerotic heart disease of native coronary artery without angina pectoris: Secondary | ICD-10-CM

## 2023-12-25 SURGERY — LEFT HEART CATH AND CORONARY ANGIOGRAPHY
Anesthesia: LOCAL

## 2024-01-02 DIAGNOSIS — I10 Essential (primary) hypertension: Secondary | ICD-10-CM | POA: Diagnosis not present

## 2024-01-02 DIAGNOSIS — R202 Paresthesia of skin: Secondary | ICD-10-CM | POA: Diagnosis not present

## 2024-01-09 DIAGNOSIS — M546 Pain in thoracic spine: Secondary | ICD-10-CM | POA: Diagnosis not present

## 2024-01-20 DIAGNOSIS — M25511 Pain in right shoulder: Secondary | ICD-10-CM | POA: Diagnosis not present

## 2024-01-20 DIAGNOSIS — I10 Essential (primary) hypertension: Secondary | ICD-10-CM | POA: Diagnosis not present

## 2024-01-20 DIAGNOSIS — R202 Paresthesia of skin: Secondary | ICD-10-CM | POA: Diagnosis not present

## 2024-01-20 DIAGNOSIS — Z79899 Other long term (current) drug therapy: Secondary | ICD-10-CM | POA: Diagnosis not present

## 2024-01-22 DIAGNOSIS — M25511 Pain in right shoulder: Secondary | ICD-10-CM | POA: Diagnosis not present

## 2024-01-22 DIAGNOSIS — M25512 Pain in left shoulder: Secondary | ICD-10-CM | POA: Diagnosis not present

## 2024-02-02 DIAGNOSIS — M25511 Pain in right shoulder: Secondary | ICD-10-CM | POA: Diagnosis not present

## 2024-02-04 DIAGNOSIS — G43009 Migraine without aura, not intractable, without status migrainosus: Secondary | ICD-10-CM | POA: Diagnosis not present

## 2024-02-04 DIAGNOSIS — I10 Essential (primary) hypertension: Secondary | ICD-10-CM | POA: Diagnosis not present

## 2024-02-04 DIAGNOSIS — I700 Atherosclerosis of aorta: Secondary | ICD-10-CM | POA: Diagnosis not present

## 2024-02-04 DIAGNOSIS — E039 Hypothyroidism, unspecified: Secondary | ICD-10-CM | POA: Diagnosis not present

## 2024-02-04 DIAGNOSIS — R202 Paresthesia of skin: Secondary | ICD-10-CM | POA: Diagnosis not present

## 2024-02-04 DIAGNOSIS — G510 Bell's palsy: Secondary | ICD-10-CM | POA: Diagnosis not present

## 2024-02-04 DIAGNOSIS — I629 Nontraumatic intracranial hemorrhage, unspecified: Secondary | ICD-10-CM | POA: Diagnosis not present

## 2024-02-04 DIAGNOSIS — E785 Hyperlipidemia, unspecified: Secondary | ICD-10-CM | POA: Diagnosis not present

## 2024-02-04 DIAGNOSIS — R002 Palpitations: Secondary | ICD-10-CM | POA: Diagnosis not present

## 2024-02-04 DIAGNOSIS — G919 Hydrocephalus, unspecified: Secondary | ICD-10-CM | POA: Diagnosis not present

## 2024-02-04 DIAGNOSIS — R0609 Other forms of dyspnea: Secondary | ICD-10-CM | POA: Diagnosis not present

## 2024-02-04 DIAGNOSIS — R0789 Other chest pain: Secondary | ICD-10-CM | POA: Diagnosis not present

## 2024-02-12 DIAGNOSIS — M25511 Pain in right shoulder: Secondary | ICD-10-CM | POA: Diagnosis not present

## 2024-02-12 DIAGNOSIS — M5030 Other cervical disc degeneration, unspecified cervical region: Secondary | ICD-10-CM | POA: Diagnosis not present

## 2024-02-12 DIAGNOSIS — M47812 Spondylosis without myelopathy or radiculopathy, cervical region: Secondary | ICD-10-CM | POA: Diagnosis not present

## 2024-02-17 DIAGNOSIS — M5412 Radiculopathy, cervical region: Secondary | ICD-10-CM | POA: Diagnosis not present

## 2024-02-24 DIAGNOSIS — M25511 Pain in right shoulder: Secondary | ICD-10-CM | POA: Diagnosis not present

## 2024-02-24 DIAGNOSIS — M5030 Other cervical disc degeneration, unspecified cervical region: Secondary | ICD-10-CM | POA: Diagnosis not present

## 2024-02-24 DIAGNOSIS — M542 Cervicalgia: Secondary | ICD-10-CM | POA: Diagnosis not present

## 2024-02-25 ENCOUNTER — Other Ambulatory Visit (HOSPITAL_COMMUNITY): Payer: Self-pay | Admitting: Internal Medicine

## 2024-02-25 DIAGNOSIS — M542 Cervicalgia: Secondary | ICD-10-CM | POA: Diagnosis not present

## 2024-02-25 DIAGNOSIS — I629 Nontraumatic intracranial hemorrhage, unspecified: Secondary | ICD-10-CM | POA: Diagnosis not present

## 2024-02-25 DIAGNOSIS — R0609 Other forms of dyspnea: Secondary | ICD-10-CM | POA: Diagnosis not present

## 2024-02-25 DIAGNOSIS — M25511 Pain in right shoulder: Secondary | ICD-10-CM | POA: Diagnosis not present

## 2024-02-25 DIAGNOSIS — G510 Bell's palsy: Secondary | ICD-10-CM | POA: Diagnosis not present

## 2024-02-25 DIAGNOSIS — I10 Essential (primary) hypertension: Secondary | ICD-10-CM | POA: Diagnosis not present

## 2024-02-25 DIAGNOSIS — R202 Paresthesia of skin: Secondary | ICD-10-CM | POA: Diagnosis not present

## 2024-02-25 DIAGNOSIS — E039 Hypothyroidism, unspecified: Secondary | ICD-10-CM | POA: Diagnosis not present

## 2024-02-25 DIAGNOSIS — G919 Hydrocephalus, unspecified: Secondary | ICD-10-CM | POA: Diagnosis not present

## 2024-02-25 DIAGNOSIS — G43009 Migraine without aura, not intractable, without status migrainosus: Secondary | ICD-10-CM | POA: Diagnosis not present

## 2024-02-25 DIAGNOSIS — E785 Hyperlipidemia, unspecified: Secondary | ICD-10-CM | POA: Diagnosis not present

## 2024-02-25 DIAGNOSIS — R002 Palpitations: Secondary | ICD-10-CM | POA: Diagnosis not present

## 2024-02-25 DIAGNOSIS — R109 Unspecified abdominal pain: Secondary | ICD-10-CM

## 2024-02-27 ENCOUNTER — Encounter (HOSPITAL_COMMUNITY): Payer: Self-pay

## 2024-02-27 ENCOUNTER — Emergency Department (HOSPITAL_COMMUNITY)
Admission: EM | Admit: 2024-02-27 | Discharge: 2024-02-27 | Disposition: A | Discharge status: Home / Self Care | Attending: Emergency Medicine | Admitting: Emergency Medicine

## 2024-02-27 ENCOUNTER — Emergency Department (HOSPITAL_COMMUNITY)

## 2024-02-27 ENCOUNTER — Other Ambulatory Visit: Payer: Self-pay

## 2024-02-27 ENCOUNTER — Ambulatory Visit (HOSPITAL_COMMUNITY): Admission: RE | Admit: 2024-02-27 | Source: Ambulatory Visit

## 2024-02-27 DIAGNOSIS — R109 Unspecified abdominal pain: Secondary | ICD-10-CM | POA: Insufficient documentation

## 2024-02-27 DIAGNOSIS — K529 Noninfective gastroenteritis and colitis, unspecified: Secondary | ICD-10-CM

## 2024-02-27 DIAGNOSIS — R1084 Generalized abdominal pain: Secondary | ICD-10-CM | POA: Diagnosis not present

## 2024-02-27 DIAGNOSIS — I10 Essential (primary) hypertension: Secondary | ICD-10-CM | POA: Diagnosis not present

## 2024-02-27 DIAGNOSIS — K429 Umbilical hernia without obstruction or gangrene: Secondary | ICD-10-CM | POA: Diagnosis not present

## 2024-02-27 LAB — URINALYSIS, ROUTINE W REFLEX MICROSCOPIC
Bilirubin Urine: NEGATIVE
Glucose, UA: NEGATIVE mg/dL
Hgb urine dipstick: NEGATIVE
Ketones, ur: NEGATIVE mg/dL
Leukocytes,Ua: NEGATIVE
Nitrite: NEGATIVE
Protein, ur: NEGATIVE mg/dL
Specific Gravity, Urine: 1.019 (ref 1.005–1.030)
pH: 5 (ref 5.0–8.0)

## 2024-02-27 LAB — COMPREHENSIVE METABOLIC PANEL WITH GFR
ALT: 14 U/L (ref 0–44)
AST: 19 U/L (ref 15–41)
Albumin: 4.1 g/dL (ref 3.5–5.0)
Alkaline Phosphatase: 59 U/L (ref 38–126)
Anion gap: 8 (ref 5–15)
BUN: 14 mg/dL (ref 8–23)
CO2: 24 mmol/L (ref 22–32)
Calcium: 8.7 mg/dL — ABNORMAL LOW (ref 8.9–10.3)
Chloride: 107 mmol/L (ref 98–111)
Creatinine, Ser: 0.57 mg/dL (ref 0.44–1.00)
GFR, Estimated: >60 mL/min (ref >60)
Glucose, Bld: 102 mg/dL — ABNORMAL HIGH (ref 70–99)
Potassium: 3.4 mmol/L — ABNORMAL LOW (ref 3.5–5.1)
Sodium: 139 mmol/L (ref 135–145)
Total Bilirubin: 0.6 mg/dL (ref 0.0–1.2)
Total Protein: 7.5 g/dL (ref 6.5–8.1)

## 2024-02-27 LAB — LIPASE, BLOOD: Lipase: 48 U/L (ref 11–51)

## 2024-02-27 LAB — CBC
HCT: 39.2 % (ref 36.0–46.0)
Hemoglobin: 13.2 g/dL (ref 12.0–15.0)
MCH: 31.1 pg (ref 26.0–34.0)
MCHC: 33.7 g/dL (ref 30.0–36.0)
MCV: 92.2 fL (ref 80.0–100.0)
Platelets: 313 K/uL (ref 150–400)
RBC: 4.25 MIL/uL (ref 3.87–5.11)
RDW: 13.4 % (ref 11.5–15.5)
WBC: 5.7 K/uL (ref 4.0–10.5)
nRBC: 0 % (ref 0.0–0.2)

## 2024-02-27 MED ORDER — MORPHINE SULFATE (PF) 4 MG/ML IV SOLN
4.0000 mg | Freq: Once | INTRAVENOUS | Status: DC
  Filled 2024-02-27: qty 1

## 2024-02-27 MED ORDER — TRAMADOL HCL 50 MG PO TABS: 50.0000 mg | ORAL_TABLET | Freq: Four times a day (QID) | ORAL | 0 refills | Status: DC | PRN

## 2024-02-27 MED ORDER — LACTATED RINGERS IV BOLUS
1000.0000 mL | Freq: Once | INTRAVENOUS | Status: AC
  Administered 2024-02-27: 1000 mL via INTRAVENOUS

## 2024-02-27 MED ORDER — ACETAMINOPHEN 500 MG PO TABS
1000.0000 mg | ORAL_TABLET | Freq: Once | ORAL | Status: AC
  Administered 2024-02-27: 1000 mg via ORAL
  Filled 2024-02-27: qty 2

## 2024-02-27 MED ORDER — SODIUM CHLORIDE 0.9 % IV SOLN
2.0000 g | Freq: Once | INTRAVENOUS | Status: AC
  Administered 2024-02-27: 2 g via INTRAVENOUS
  Filled 2024-02-27: qty 20

## 2024-02-27 MED ORDER — AMOXICILLIN-POT CLAVULANATE 875-125 MG PO TABS: 1.0000 | ORAL_TABLET | Freq: Two times a day (BID) | ORAL | 0 refills | Status: AC

## 2024-02-27 MED ORDER — ONDANSETRON HCL 4 MG/2ML IJ SOLN
4.0000 mg | Freq: Once | INTRAMUSCULAR | Status: AC
  Administered 2024-02-27: 4 mg via INTRAVENOUS
  Filled 2024-02-27: qty 2

## 2024-02-27 MED ORDER — IOHEXOL 300 MG/ML  SOLN
100.0000 mL | Freq: Once | INTRAMUSCULAR | Status: AC | PRN
  Administered 2024-02-27: 100 mL via INTRAVENOUS

## 2024-02-27 MED ORDER — METRONIDAZOLE 500 MG/100ML IV SOLN
500.0000 mg | Freq: Once | INTRAVENOUS | Status: AC
  Administered 2024-02-27: 500 mg via INTRAVENOUS
  Filled 2024-02-27: qty 100

## 2024-02-27 NOTE — ED Notes (Signed)
 ED Provider at bedside.

## 2024-02-27 NOTE — ED Triage Notes (Signed)
 Pov from home. Cc of abdominal and flank pain x1 week. Went to pcp and they ordered a ct for tonight at 730 but at 0200 woke up and was vomiting and says she was sweaty. Didn't think she could wait.   Burning with urination  Hx of kidney stones. 8/10 pressure

## 2024-02-27 NOTE — ED Provider Notes (Signed)
 [place] EMERGENCY DEPARTMENT AT [facility] Provider Note   CSN: [id number] Arrival date & time: 02/27/24  9461     History Chief Complaint  Patient presents with   Abdominal Pain    HPI GIULIANNA Miranda is a 73 y.o. female presenting for chief ocmplaint of abdominal pain. HX of UL/UTI. Was on macrobid this week and was improving before suddenly worsening tonight Started on Diamox recently and CT OP ordered for fluid evaluation Also having GI sxs. Patient's recorded medical, surgical, social, medication list and allergies were reviewed in the Snapshot window as part of the initial history.   Review of Systems   Review of Systems  Constitutional:  Negative for chills and fever.  HENT:  Negative for ear pain and sore throat.   Eyes:  Negative for pain and visual disturbance.  Respiratory:  Negative for cough and shortness of breath.   Cardiovascular:  Negative for chest pain and palpitations.  Gastrointestinal:  Positive for abdominal pain, nausea and vomiting.  Genitourinary:  Negative for dysuria and hematuria.  Musculoskeletal:  Negative for arthralgias and back pain.  Skin:  Negative for color change and rash.  Neurological:  Negative for seizures and syncope.  All other systems reviewed and are negative.   Physical Exam Updated Vital Signs BP 118/60   Pulse 74   Temp 98.2 F (36.8 C) (Oral)   Resp 18   Ht 5' 3 (1.6 m)   Wt 65.7 kg   SpO2 97%   BMI 25.66 kg/m  Physical Exam Vitals and nursing note reviewed.  Constitutional:      General: She is not in acute distress.    Appearance: She is well-developed.  HENT:     Head: Normocephalic and atraumatic.  Eyes:     Conjunctiva/sclera: Conjunctivae normal.  Cardiovascular:     Rate and Rhythm: Normal rate and regular rhythm.     Heart sounds: No murmur heard. Pulmonary:     Effort: Pulmonary effort is normal. No respiratory distress.     Breath sounds: Normal breath sounds.  Abdominal:      General: There is no distension.     Palpations: Abdomen is soft.     Tenderness: There is abdominal tenderness. There is guarding. There is no right CVA tenderness or left CVA tenderness.  Musculoskeletal:        General: No swelling or tenderness. Normal range of motion.     Cervical back: Neck supple.  Skin:    General: Skin is warm and dry.  Neurological:     General: No focal deficit present.     Mental Status: She is alert and oriented to person, place, and time. Mental status is at baseline.     Cranial Nerves: No cranial nerve deficit.      ED Course/ Medical Decision Making/ A&P    Procedures Procedures   Medications Ordered in ED Medications  acetaminophen  (TYLENOL ) tablet 1,000 mg (has no administration in time range)  lactated ringers  bolus 1,000 mL (1,000 mLs Intravenous New Bag/Given 02/27/24 0639)  ondansetron  (ZOFRAN ) injection 4 mg (4 mg Intravenous Given 02/27/24 0636)   Medical Decision Making:   Elizabeth Miranda is a 73 y.o. female who presented to the ED today with abdominal pain, detailed above.    Additional history discussed with patient's family/caregivers.  Patient placed on continuous vitals and telemetry monitoring while in ED which was reviewed periodically.  Complete initial physical exam performed, notably the patient  was HDS in  NAD.     Reviewed and confirmed nursing documentation for past medical history, family history, social history.    Initial Assessment:   With the patient's presentation of abdominal pain, most likely diagnosis is nonspecific etiology. Other diagnoses were considered including (but not limited to) gastroenteritis, colitis, small bowel obstruction, appendicitis, cholecystitis, pancreatitis, nephrolithiasis, UTI, pyleonephritis. These are considered less likely due to history of present illness and physical exam findings.   This is most consistent with an acute life/limb threatening illness complicated by underlying chronic  conditions.   Initial Plan:  CBC/CMP to evaluate for underlying infectious/metabolic etiology for patient's abdominal pain  Lipase to evaluate for pancreatitis  EKG to evaluate for cardiac source of pain  CTAB/Pelvis with contrast to evaluate for structural/surgical etiology of patients' severe abdominal pain.  Urinalysis and repeat physical assessment to evaluate for UTI/Pyelonpehritis  Empiric management of symptoms with escalating pain control and antiemetics as needed.   Reassessment: Patient's arrival to the emergency department was just before, signout to oncoming team. Laboratory studies, CT scan are all still pending at this time, team working on IV access.  Patient has declined IV narcotics at this time for symptom management. Anticipate disposition per studies as ordered.    Clinical Impression:  1. Abdominal pain, unspecified abdominal location      Data Unavailable   Final Clinical Impression(s) / ED Diagnoses Final diagnoses:  Abdominal pain, unspecified abdominal location    Rx / DC Orders ED Discharge Orders     None         Jerral Meth, MD 02/27/24 [phone]

## 2024-02-27 NOTE — ED Provider Notes (Signed)
 Signout from Dr. Jerral.  73 year old female complaining of abdomen and flank pain, diarrhea.  Recently put on antibiotics Macrobid for UTI.  She is pending lab work and CT imaging.  Disposition per results of testing. Physical Exam  BP 118/60   Pulse 74   Temp 98.2 F (36.8 C) (Oral)   Resp 18   Ht 5' 3 (1.6 m)   Wt 65.7 kg   SpO2 97%   BMI 25.66 kg/m   Physical Exam  Procedures  Procedures  ED Course / MDM    Medical Decision Making Amount and/or Complexity of Data Reviewed Labs: ordered. Radiology: ordered.  Risk OTC drugs. Prescription drug management.   10 AM.  Patient CT showing colitis.  Reviewed with the patient.  She would like to do an IV dose of medications here and then make a decision whether she is comfortable enough going home or not.  Ultimately patient felt like she would like to try to go home on oral medication.  Prescriptions for Augmentin  and tramadol .  Strict return instructions discussed.       Towana Ozell BROCKS, MD 02/27/24 1655

## 2024-02-27 NOTE — Discharge Instructions (Addendum)
 You are seen in the emergency department for abdominal pain.  Your CAT scan showed colitis.  This is usually treated with antibiotics and bowel rest.  We are prescribing you Augmentin ,  and tramadol  for pain.  Please start with a clear liquid diet advance as tolerated.  Follow-up with your primary care doctor and gastroenterology.  Return if any worsening or concerning symptoms.

## 2024-02-27 NOTE — ED Notes (Signed)
Pt attempting to give a urine sample

## 2024-03-01 ENCOUNTER — Ambulatory Visit: Attending: Cardiology | Admitting: Cardiology

## 2024-03-01 ENCOUNTER — Encounter: Payer: Self-pay | Admitting: Cardiology

## 2024-03-01 VITALS — BP 118/80 | HR 74 | Ht 63.0 in | Wt 141.8 lb

## 2024-03-01 DIAGNOSIS — R002 Palpitations: Secondary | ICD-10-CM | POA: Diagnosis not present

## 2024-03-01 DIAGNOSIS — E782 Mixed hyperlipidemia: Secondary | ICD-10-CM | POA: Diagnosis not present

## 2024-03-01 NOTE — Patient Instructions (Addendum)

## 2024-03-01 NOTE — Progress Notes (Signed)
 Cardiology Office Note  Date: 03/01/2024   ID: Elizabeth Miranda, DOB [birth year], MRN [id number]  History of Present Illness: Elizabeth Miranda is a 73 y.o. female former patient of Dr. Alveta and then Dr. Hobart, now presenting to establish follow-up with me.  Her husband is a patient of mine.  I reviewed her records.  She is here for a routine visit.  Reports no palpitations or chest discomfort with activity, no increasing dyspnea on exertion.  She has had some orthopedic concerns, indicates some degree of cervical disc disease with plan for further workup regarding nerve impingement.  She is wearing a soft collar today.  Medications reviewed.  She is now on Repatha 140 mg which she is taking once a month, started in the interim by Dr. Shona.  She has a prior history of statin intolerance.  She is no longer taking low-dose Imdur  which had been started previously by Dr. Hobart for possible microvascular angina.  She does remain on a baby aspirin  daily.  CPX in October 2024 was submaximal but suggestive of deconditioning.  Echocardiogram from December 2023 revealed LVEF 60 to 65%, no significant valvular disease.  Physical Exam: VS:  BP 118/80   Pulse 74   Ht 5' 3 (1.6 m)   Wt 141 lb 12.8 oz (64.3 kg)   SpO2 98%   BMI 25.12 kg/m , BMI Body mass index is 25.12 kg/m.  Wt Readings from Last 3 Encounters:  03/01/24 141 lb 12.8 oz (64.3 kg)  02/27/24 144 lb 13.5 oz (65.7 kg)  09/02/23 144 lb 12.8 oz (65.7 kg)    General: Patient appears comfortable at rest. HEENT: Conjunctiva and lids normal. Neck: Wearing soft collar. Lungs: Clear to auscultation, nonlabored breathing at rest. Cardiac: Regular rate and rhythm, no S3, 2/6 basal systolic murmur, no pericardial rub.Elizabeth Miranda Extremities: No pitting edema of the legs.  ECG:  An ECG dated 02/27/2024 was personally reviewed today and demonstrated:  Sinus rhythm with decreased R wave progression and lead motion  artifact.  Labwork: 02/27/2024: ALT 14; AST 19; BUN 14; Creatinine, Ser 0.57; Hemoglobin 13.2; Platelets 313; Potassium 3.4; Sodium 139     Component Value Date/Time   CHOL 187 10/07/2022 1610   CHOL 214 (H) 05/27/2022 1611   TRIG 229 (H) 10/07/2022 1610   HDL 42 10/07/2022 1610   HDL 52 05/27/2022 1611   CHOLHDL 4.5 10/07/2022 1610   VLDL 46 (H) 10/07/2022 1610   LDLCALC 99 10/07/2022 1610   LDLCALC 129 (H) 05/27/2022 1611   Other Studies Reviewed Today:  Coronary CTA 09/02/2022: IMPRESSION: 1.  Calcium score 0   2.  Normal right dominant coronary arteries   3.  Normal ascending thoracic aorta 3.1 cm  Assessment and Plan:  1.  History of palpitations.  Cardiac monitor in January 2024 showed brief episodes of atrial tachycardia, no sustained arrhythmias.  No obvious atrial fibrillation.  She does not report any significant symptoms at this time and has not had to use as needed propranolol  10 mg tablets.  Recent ECG reviewed.  Continue observation.  2.  Hypothyroidism.  Following with PCP.  TSH 1.6 in December 2023.  Currently on Synthroid  112 mcg daily.  3.  History of hydrocephalus and myelitis, managed by neurological specialist based in [place] .  4.  Coronary calcium score of 0 with normal coronary arteries by coronary CTA in March 2024.  She was managed with low-dose Imdur  for possible microvascular angina by Dr. Hobart, currently off medication and  not reporting any symptoms.  5.  Mixed hyperlipidemia.  Now on Repatha 140 mg which she is taking once a month, followed by Dr. Shona.  She has a history of statin intolerance.  Disposition:  Follow up 1 year.  Signed, Jayson JUDITHANN Sierras, M.D., F.A.C.C. [facility] at [facility]

## 2024-03-13 DIAGNOSIS — M5412 Radiculopathy, cervical region: Secondary | ICD-10-CM | POA: Diagnosis not present

## 2024-03-22 ENCOUNTER — Encounter: Payer: Self-pay | Admitting: *Deleted

## 2024-03-22 NOTE — Progress Notes (Signed)
 CATTIE TINEO                                          MRN: [id number]   03/22/2024   The VBCI Quality Team Specialist reviewed this patient medical record for the purposes of chart review for care gap closure. The following were reviewed: chart review for care gap closure-kidney health evaluation for diabetes:eGFR  and uACR.    VBCI Quality Team

## 2024-03-23 DIAGNOSIS — M542 Cervicalgia: Secondary | ICD-10-CM | POA: Diagnosis not present

## 2024-03-29 DIAGNOSIS — M542 Cervicalgia: Secondary | ICD-10-CM | POA: Diagnosis not present

## 2024-03-29 DIAGNOSIS — M6281 Muscle weakness (generalized): Secondary | ICD-10-CM | POA: Diagnosis not present

## 2024-03-30 NOTE — Progress Notes (Signed)
 Elizabeth Miranda                                          MRN: [id number]   03/30/2024   The VBCI Quality Team Specialist reviewed this patient medical record for the purposes of chart review for care gap closure. The following were reviewed: chart review for care gap closure-breast cancer screening, colorectal cancer screening, diabetic eye exam, glycemic status assessment, and kidney health evaluation for diabetes:eGFR  and uACR.    VBCI Quality Team

## 2024-03-31 DIAGNOSIS — M5412 Radiculopathy, cervical region: Secondary | ICD-10-CM | POA: Diagnosis not present

## 2024-04-06 DIAGNOSIS — M542 Cervicalgia: Secondary | ICD-10-CM | POA: Diagnosis not present

## 2024-04-06 DIAGNOSIS — M6281 Muscle weakness (generalized): Secondary | ICD-10-CM | POA: Diagnosis not present

## 2024-04-07 DIAGNOSIS — I10 Essential (primary) hypertension: Secondary | ICD-10-CM | POA: Diagnosis not present

## 2024-04-07 DIAGNOSIS — G510 Bell's palsy: Secondary | ICD-10-CM | POA: Diagnosis not present

## 2024-04-07 DIAGNOSIS — G43009 Migraine without aura, not intractable, without status migrainosus: Secondary | ICD-10-CM | POA: Diagnosis not present

## 2024-04-07 DIAGNOSIS — G919 Hydrocephalus, unspecified: Secondary | ICD-10-CM | POA: Diagnosis not present

## 2024-04-07 DIAGNOSIS — M542 Cervicalgia: Secondary | ICD-10-CM | POA: Diagnosis not present

## 2024-04-07 DIAGNOSIS — I629 Nontraumatic intracranial hemorrhage, unspecified: Secondary | ICD-10-CM | POA: Diagnosis not present

## 2024-04-07 DIAGNOSIS — G4700 Insomnia, unspecified: Secondary | ICD-10-CM | POA: Diagnosis not present

## 2024-04-07 DIAGNOSIS — E785 Hyperlipidemia, unspecified: Secondary | ICD-10-CM | POA: Diagnosis not present

## 2024-04-07 DIAGNOSIS — R202 Paresthesia of skin: Secondary | ICD-10-CM | POA: Diagnosis not present

## 2024-04-07 DIAGNOSIS — Z0000 Encounter for general adult medical examination without abnormal findings: Secondary | ICD-10-CM | POA: Diagnosis not present

## 2024-04-07 DIAGNOSIS — R0609 Other forms of dyspnea: Secondary | ICD-10-CM | POA: Diagnosis not present

## 2024-04-07 DIAGNOSIS — M25511 Pain in right shoulder: Secondary | ICD-10-CM | POA: Diagnosis not present

## 2024-04-08 DIAGNOSIS — M542 Cervicalgia: Secondary | ICD-10-CM | POA: Diagnosis not present

## 2024-04-08 DIAGNOSIS — M6281 Muscle weakness (generalized): Secondary | ICD-10-CM | POA: Diagnosis not present

## 2024-04-12 DIAGNOSIS — M542 Cervicalgia: Secondary | ICD-10-CM | POA: Diagnosis not present

## 2024-04-12 DIAGNOSIS — M6281 Muscle weakness (generalized): Secondary | ICD-10-CM | POA: Diagnosis not present

## 2024-04-15 DIAGNOSIS — M542 Cervicalgia: Secondary | ICD-10-CM | POA: Diagnosis not present

## 2024-04-15 DIAGNOSIS — M6281 Muscle weakness (generalized): Secondary | ICD-10-CM | POA: Diagnosis not present

## 2024-05-24 NOTE — Progress Notes (Signed)
 Elizabeth Miranda                                          MRN: [id number]   05/24/2024   The VBCI Quality Team Specialist reviewed this patient medical record for the purposes of chart review for care gap closure. The following were reviewed: chart review for care gap closure-diabetic eye exam, glycemic status assessment, and kidney health evaluation for diabetes:eGFR  and uACR.    VBCI Quality Team

## 2024-05-25 ENCOUNTER — Other Ambulatory Visit (HOSPITAL_COMMUNITY): Payer: Self-pay | Admitting: Internal Medicine

## 2024-05-25 DIAGNOSIS — I6522 Occlusion and stenosis of left carotid artery: Secondary | ICD-10-CM

## 2024-05-25 DIAGNOSIS — R109 Unspecified abdominal pain: Secondary | ICD-10-CM

## 2024-05-26 LAB — COLOGUARD: COLOGUARD: NEGATIVE

## 2024-05-28 ENCOUNTER — Ambulatory Visit (HOSPITAL_COMMUNITY)
Admission: RE | Admit: 2024-05-28 | Discharge: 2024-05-28 | Disposition: A | Source: Ambulatory Visit | Attending: Internal Medicine | Admitting: Internal Medicine

## 2024-05-28 DIAGNOSIS — I6522 Occlusion and stenosis of left carotid artery: Secondary | ICD-10-CM

## 2024-05-28 MED ORDER — IOHEXOL 350 MG/ML SOLN
75.0000 mL | Freq: Once | INTRAVENOUS | Status: AC | PRN
  Administered 2024-05-28: 75 mL via INTRAVENOUS

## 2024-05-29 ENCOUNTER — Ambulatory Visit (HOSPITAL_COMMUNITY): Admission: RE | Admit: 2024-05-29

## 2024-05-29 ENCOUNTER — Ambulatory Visit (HOSPITAL_COMMUNITY): Admission: RE | Admit: 2024-05-29 | Discharge: 2024-05-29 | Attending: Internal Medicine | Admitting: Internal Medicine

## 2024-05-29 DIAGNOSIS — R519 Headache, unspecified: Secondary | ICD-10-CM

## 2024-05-29 DIAGNOSIS — I6522 Occlusion and stenosis of left carotid artery: Secondary | ICD-10-CM

## 2024-05-29 MED ORDER — GADOBUTROL 1 MMOL/ML IV SOLN
6.0000 mL | Freq: Once | INTRAVENOUS | Status: AC | PRN
  Administered 2024-05-29: 6 mL via INTRAVENOUS

## 2024-06-22 ENCOUNTER — Encounter: Payer: Self-pay | Admitting: *Deleted

## 2024-06-22 DIAGNOSIS — R109 Unspecified abdominal pain: Secondary | ICD-10-CM

## 2024-06-22 NOTE — Progress Notes (Signed)
 Elizabeth Miranda                                          MRN: [id number]   06/22/2024   The VBCI Quality Team Specialist reviewed this patient medical record for the purposes of chart review for care gap closure. The following were reviewed: chart review for care gap closure-glycemic status assessment.    VBCI Quality Team

## 2024-06-23 ENCOUNTER — Encounter (HOSPITAL_COMMUNITY): Payer: Self-pay | Admitting: Family Medicine

## 2024-06-23 ENCOUNTER — Encounter (HOSPITAL_BASED_OUTPATIENT_CLINIC_OR_DEPARTMENT_OTHER): Payer: Self-pay

## 2024-06-23 ENCOUNTER — Ambulatory Visit (HOSPITAL_BASED_OUTPATIENT_CLINIC_OR_DEPARTMENT_OTHER)
Admission: RE | Admit: 2024-06-23 | Discharge: 2024-06-23 | Disposition: A | Discharge status: Home / Self Care | Source: Ambulatory Visit | Attending: Family Medicine | Admitting: Family Medicine

## 2024-06-23 ENCOUNTER — Other Ambulatory Visit: Payer: Self-pay | Admitting: Internal Medicine

## 2024-06-23 DIAGNOSIS — R109 Unspecified abdominal pain: Secondary | ICD-10-CM | POA: Insufficient documentation

## 2024-06-23 MED ORDER — IOHEXOL 300 MG/ML  SOLN
100.0000 mL | Freq: Once | INTRAMUSCULAR | Status: AC | PRN
  Administered 2024-06-23: 100 mL via INTRAVENOUS

## 2024-06-24 ENCOUNTER — Emergency Department (HOSPITAL_COMMUNITY)
Admission: EM | Admit: 2024-06-24 | Discharge: 2024-06-24 | Disposition: A | Discharge status: Home / Self Care | Attending: Emergency Medicine | Admitting: Emergency Medicine

## 2024-06-24 ENCOUNTER — Other Ambulatory Visit: Payer: Self-pay

## 2024-06-24 ENCOUNTER — Emergency Department (HOSPITAL_COMMUNITY)

## 2024-06-24 ENCOUNTER — Encounter (HOSPITAL_COMMUNITY): Payer: Self-pay | Admitting: Emergency Medicine

## 2024-06-24 DIAGNOSIS — Z7982 Long term (current) use of aspirin: Secondary | ICD-10-CM | POA: Diagnosis not present

## 2024-06-24 DIAGNOSIS — M25551 Pain in right hip: Secondary | ICD-10-CM | POA: Diagnosis present

## 2024-06-24 DIAGNOSIS — R1031 Right lower quadrant pain: Secondary | ICD-10-CM | POA: Insufficient documentation

## 2024-06-24 DIAGNOSIS — M5416 Radiculopathy, lumbar region: Secondary | ICD-10-CM | POA: Diagnosis not present

## 2024-06-24 LAB — COMPREHENSIVE METABOLIC PANEL WITH GFR
ALT: 23 U/L (ref 0–44)
AST: 29 U/L (ref 15–41)
Albumin: 4.5 g/dL (ref 3.5–5.0)
Alkaline Phosphatase: 60 U/L (ref 38–126)
Anion gap: 15 (ref 5–15)
BUN: 10 mg/dL (ref 8–23)
CO2: 22 mmol/L (ref 22–32)
Calcium: 9.5 mg/dL (ref 8.9–10.3)
Chloride: 104 mmol/L (ref 98–111)
Creatinine, Ser: 0.64 mg/dL (ref 0.44–1.00)
GFR, Estimated: >60 mL/min
Glucose, Bld: 85 mg/dL (ref 70–99)
Potassium: 4.2 mmol/L (ref 3.5–5.1)
Sodium: 141 mmol/L (ref 135–145)
Total Bilirubin: 0.5 mg/dL (ref 0.0–1.2)
Total Protein: 7.3 g/dL (ref 6.5–8.1)

## 2024-06-24 LAB — CBC WITH DIFFERENTIAL/PLATELET
Abs Immature Granulocytes: 0.01 K/uL (ref 0.00–0.07)
Basophils Absolute: 0 K/uL (ref 0.0–0.1)
Basophils Relative: 0 %
Eosinophils Absolute: 0.1 K/uL (ref 0.0–0.5)
Eosinophils Relative: 1 %
HCT: 40.4 % (ref 36.0–46.0)
Hemoglobin: 13.3 g/dL (ref 12.0–15.0)
Immature Granulocytes: 0 %
Lymphocytes Relative: 35 %
Lymphs Abs: 1.8 K/uL (ref 0.7–4.0)
MCH: 30.8 pg (ref 26.0–34.0)
MCHC: 32.9 g/dL (ref 30.0–36.0)
MCV: 93.5 fL (ref 80.0–100.0)
Monocytes Absolute: 0.6 K/uL (ref 0.1–1.0)
Monocytes Relative: 11 %
Neutro Abs: 2.7 K/uL (ref 1.7–7.7)
Neutrophils Relative %: 53 %
Platelets: 291 K/uL (ref 150–400)
RBC: 4.32 MIL/uL (ref 3.87–5.11)
RDW: 13.4 % (ref 11.5–15.5)
WBC: 5.1 K/uL (ref 4.0–10.5)
nRBC: 0 % (ref 0.0–0.2)

## 2024-06-24 LAB — URINALYSIS, ROUTINE W REFLEX MICROSCOPIC
Bilirubin Urine: NEGATIVE
Glucose, UA: NEGATIVE mg/dL
Hgb urine dipstick: NEGATIVE
Ketones, ur: NEGATIVE mg/dL
Leukocytes,Ua: NEGATIVE
Nitrite: NEGATIVE
Protein, ur: NEGATIVE mg/dL
Specific Gravity, Urine: 1.012 (ref 1.005–1.030)
pH: 6 (ref 5.0–8.0)

## 2024-06-24 MED ORDER — PREDNISONE 10 MG (21) PO TBPK: ORAL_TABLET | ORAL | 0 refills | Status: AC

## 2024-06-24 MED ORDER — TRAMADOL HCL 50 MG PO TABS: 50.0000 mg | ORAL_TABLET | Freq: Four times a day (QID) | ORAL | 0 refills | Status: AC | PRN

## 2024-06-24 NOTE — ED Notes (Signed)
 See triage notes. Pt points to right lower back when asked where her pain is. Pt states with movement it radiates around right flank and into right side groin. Pt rating pain sitting still 7, 10 with movement. A/o. Nad. Denies gu changes.

## 2024-06-24 NOTE — ED Triage Notes (Signed)
 Pt in w/ complaints of right lower abd pain and right lower back pain x3 weeks. Pt had a out pt CT yesterday ordered by her PCP. Pt states the pain is becoming to severe to handle.

## 2024-06-24 NOTE — ED Provider Notes (Signed)
 " [place] EMERGENCY DEPARTMENT AT [facility] Provider Note   CSN: [id number] Arrival date & time: 06/24/24  9163     Patient presents with: Abdominal Pain   JAYLEEN SCAGLIONE is a 74 y.o. female.   Pt complains of right sided abdominal pain and right hip pain.  Pt reports she has had recent falls.  Pt has seen her primary care MD and had a ct scan of her abdomen done yesterday.  Pt reports she is concerned that she has injured something.  Pt's daughter reports pt is having difficulty walking due to pain.  Pt denies fever or chills.   No nausea or vomiting.   The history is provided by the patient. No language interpreter was used.  Abdominal Pain      Prior to Admission medications  Medication Sig Start Date End Date Taking? Authorizing Provider  acetaminophen  (TYLENOL ) 325 MG tablet Take 2 tablets (650 mg total) by mouth every 6 (six) hours as needed for mild pain, moderate pain or fever. 10/08/22   Pearlean Manus, MD  acyclovir (ZOVIRAX) 800 MG tablet Take 800 mg by mouth as needed. 12/18/22   [provider]  amoxicillin -clavulanate (AUGMENTIN ) 875-125 MG tablet Take 1 tablet by mouth every 12 (twelve) hours. 02/27/24   Towana Ozell BROCKS, MD  aspirin  EC 81 MG tablet Take 81 mg by mouth daily. Swallow whole.    [provider]  B Complex-C (B-COMPLEX WITH VITAMIN C) tablet Take 1 tablet by mouth daily.    [provider]  cholecalciferol (VITAMIN D3) 25 MCG (1000 UNIT) tablet Take 10,000 Units by mouth daily.    [provider]  clonazePAM (KLONOPIN) 1 MG tablet Take 1 mg by mouth at bedtime.    [provider]  ESTRADIOL  PO Take 1.5 mg by mouth daily. Compound into a capsule    [provider]  furosemide  (LASIX ) 20 MG tablet Take 1 tablet (20 mg total) by mouth daily as needed. Patient not taking: Reported on 03/01/2024 09/02/23   Wyn Jackee VEAR Mickey., NP  isosorbide  mononitrate (IMDUR ) 30 MG 24 hr tablet Take 0.5  tablets (15 mg total) by mouth daily as needed. Patient not taking: Reported on 03/01/2024 09/25/23   Wyn Jackee VEAR Mickey., NP  levothyroxine  (SYNTHROID , LEVOTHROID) 100 MCG tablet Take 112 mcg by mouth daily. 06/29/14   [provider]  liothyronine (CYTOMEL) 5 MCG tablet Take 5 mcg by mouth daily. 01/16/23   [provider]  methocarbamol (ROBAXIN) 750 MG tablet Take 1 tablet by mouth every 8 (eight) hours as needed. 10/21/22   [provider]  MISC NATURAL PRODUCTS PO Take by mouth daily. Neo 40 - nitric oxide    [provider]  MISC NATURAL PRODUCTS PO Take by mouth. Prodrome GLIA Prodrome Neuro Prodrome [employer]    [provider]  Multiple Vitamin (MULITIVITAMIN WITH MINERALS) TABS Take 1 tablet by mouth daily.    [provider]  nitrofurantoin, macrocrystal-monohydrate, (MACROBID) 100 MG capsule Take 100 mg by mouth as needed.    [provider]  potassium chloride (KLOR-CON) 10 MEQ tablet Take 10 mEq by mouth daily.    [provider]  PROGESTERONE  PO Take 300 mg by mouth at bedtime.    [provider]  propranolol  (INDERAL ) 10 MG tablet Take 1 tablet (10 mg total) by mouth daily as needed. 09/02/23   Wyn Jackee VEAR Mickey., NP  REPATHA SURECLICK 140 MG/ML SOAJ Inject 140 mg as directed  every 14 (fourteen) days.    [provider]  traMADol  (ULTRAM ) 50 MG tablet Take 1 tablet (50 mg total) by mouth every 6 (six) hours as needed. Patient not taking: Reported on 03/01/2024 02/27/24   Towana Ozell BROCKS, MD    Allergies: Sulfa antibiotics, Sulfamethoxazole, and Codeine    Review of Systems  Gastrointestinal:  Positive for abdominal pain.  Genitourinary:  Positive for flank pain.  All other systems reviewed and are negative.   Updated Vital Signs BP 131/67   Pulse 63   Temp 98.6 F (37 C) (Oral)   Resp 19   Ht 5' 3 (1.6 m)   Wt 65 kg   SpO2 99%   BMI 25.38 kg/m   Physical Exam Vitals and nursing note  reviewed.  Constitutional:      Appearance: She is well-developed.     Comments: tearful  HENT:     Head: Normocephalic.  Cardiovascular:     Rate and Rhythm: Normal rate.  Pulmonary:     Effort: Pulmonary effort is normal.  Abdominal:     General: Bowel sounds are normal. There is no distension.     Palpations: Abdomen is soft.     Tenderness: There is abdominal tenderness in the right lower quadrant.  Musculoskeletal:        General: Normal range of motion.     Cervical back: Normal range of motion.  Skin:    General: Skin is warm.  Neurological:     General: No focal deficit present.     Mental Status: She is alert and oriented to person, place, and time.     (all labs ordered are listed, but only abnormal results are displayed) Labs Reviewed  URINALYSIS, ROUTINE W REFLEX MICROSCOPIC - Abnormal; Notable for the following components:      Result Value   APPearance HAZY (*)    All other components within normal limits  CBC WITH DIFFERENTIAL/PLATELET  COMPREHENSIVE METABOLIC PANEL WITH GFR    EKG: None  Radiology: CT ABDOMEN PELVIS W CONTRAST Result Date: 06/23/2024 EXAM: CT ABDOMEN AND PELVIS WITH CONTRAST 06/23/2024 01:58:17 PM TECHNIQUE: CT of the abdomen and pelvis was performed with the administration of 100 mL of iohexol  (OMNIPAQUE ) 300 MG/ML solution. Multiplanar reformatted images are provided for review. Automated exposure control, iterative reconstruction, and/or weight-based adjustment of the mA/kV was utilized to reduce the radiation dose to as low as reasonably achievable. COMPARISON: CT abdomen and pelvis dated 02/27/2024. CLINICAL HISTORY: Rt sided abd pain x3wks. Nausea after eating past few days. FINDINGS: LOWER CHEST: No acute abnormality. LIVER: The liver is unremarkable. GALLBLADDER AND BILE DUCTS: No gallbladder inflammation. No biliary ductal dilatation. SPLEEN: No acute abnormality. PANCREAS: No acute abnormality. ADRENAL GLANDS: No acute abnormality.  KIDNEYS, URETERS AND BLADDER: No stones in the kidneys or ureters. No hydronephrosis. No perinephric or periureteral stranding. Urinary bladder is unremarkable. GI AND BOWEL: Stomach demonstrates no acute abnormality. Post appendectomy. No bowel inflammation. There is no bowel obstruction. PERITONEUM AND RETROPERITONEUM: No ascites. No free air. VASCULATURE: Aorta is normal in caliber. LYMPH NODES: No lymphadenopathy. REPRODUCTIVE ORGANS: Post hysterectomy. BONES AND SOFT TISSUES: No acute osseous abnormality. No focal soft tissue abnormality. IMPRESSION: 1. No acute findings in the abdomen or pelvis. Electronically signed by: Norleen Boxer MD 06/23/2024 02:13 PM EST RP Workstation: [id number]     Procedures   Medications Ordered in the ED - No data to display  Clinical Course as of 06/24/24 1304  Thu Jun 24, 2024  1225 DG Hip Unilat W or Wo Pelvis 2-3 Views Right [LS]    Clinical Course User Index [LS] [place] Sonny POUR, [place]                                 Medical Decision Making Pt complains of pain in her right flank, right hip and right lower abdomen  Amount and/or Complexity of Data Reviewed Independent Historian:     Details: Pt is here with her daughter who is supportive  Labs: ordered. Radiology: ordered.  Risk Prescription drug management.        Final diagnoses:  Lumbar back pain with radiculopathy affecting right lower extremity    ED Discharge Orders          Ordered    traMADol  (ULTRAM ) 50 MG tablet  Every 6 hours PRN        06/24/24 1220    predniSONE  (STERAPRED UNI-PAK 21 TAB) 10 MG (21) TBPK tablet       Note to Pharmacy: Please provide dose pack   06/24/24 1304            An After Visit Summary was printed and given to the patient.    Danzig Macgregor K, PA-C 06/24/24 1304    Yolande Lamar BROCKS, MD 06/25/24 1807  "

## 2024-06-29 ENCOUNTER — Encounter: Payer: Self-pay | Admitting: Internal Medicine
